# Patient Record
Sex: Male | Born: 1965 | Race: White | Hispanic: No | Marital: Married | Smoking: Never smoker
Health system: Southern US, Community
[De-identification: ages and names within clinical notes are randomized; demographics above are authoritative.]

## PROBLEM LIST (undated history)

## (undated) DIAGNOSIS — E119 Type 2 diabetes mellitus without complications: Secondary | ICD-10-CM

## (undated) DIAGNOSIS — I2510 Atherosclerotic heart disease of native coronary artery without angina pectoris: Secondary | ICD-10-CM

## (undated) DIAGNOSIS — E785 Hyperlipidemia, unspecified: Secondary | ICD-10-CM

## (undated) HISTORY — PX: ABDOMINAL SURGERY: SHX537

## (undated) HISTORY — DX: Hyperlipidemia, unspecified: E78.5

## (undated) HISTORY — DX: Atherosclerotic heart disease of native coronary artery without angina pectoris: I25.10

## (undated) HISTORY — DX: Type 2 diabetes mellitus without complications: E11.9

## (undated) HISTORY — PX: EYE SURGERY: SHX253

---

## 2008-02-25 ENCOUNTER — Encounter: Admission: RE | Admit: 2008-02-25 | Discharge: 2008-02-25 | Payer: Self-pay | Admitting: Gastroenterology

## 2009-10-31 ENCOUNTER — Inpatient Hospital Stay (HOSPITAL_COMMUNITY): Admission: EM | Admit: 2009-10-31 | Discharge: 2009-11-02 | Payer: Self-pay | Admitting: Emergency Medicine

## 2009-10-31 ENCOUNTER — Emergency Department (HOSPITAL_COMMUNITY): Admission: EM | Admit: 2009-10-31 | Discharge: 2009-10-31 | Payer: Self-pay | Admitting: Emergency Medicine

## 2010-10-06 ENCOUNTER — Encounter: Payer: Self-pay | Admitting: Internal Medicine

## 2010-12-05 LAB — GLUCOSE, CAPILLARY
Glucose-Capillary: 133 mg/dL — ABNORMAL HIGH (ref 70–99)
Glucose-Capillary: 136 mg/dL — ABNORMAL HIGH (ref 70–99)
Glucose-Capillary: 169 mg/dL — ABNORMAL HIGH (ref 70–99)
Glucose-Capillary: 175 mg/dL — ABNORMAL HIGH (ref 70–99)
Glucose-Capillary: 178 mg/dL — ABNORMAL HIGH (ref 70–99)
Glucose-Capillary: 190 mg/dL — ABNORMAL HIGH (ref 70–99)
Glucose-Capillary: 218 mg/dL — ABNORMAL HIGH (ref 70–99)

## 2010-12-05 LAB — COMPREHENSIVE METABOLIC PANEL
ALT: 46 U/L (ref 0–53)
AST: 40 U/L — ABNORMAL HIGH (ref 0–37)
BUN: 14 mg/dL (ref 6–23)
BUN: 15 mg/dL (ref 6–23)
CO2: 26 mEq/L (ref 19–32)
Calcium: 9 mg/dL (ref 8.4–10.5)
Calcium: 9.1 mg/dL (ref 8.4–10.5)
Chloride: 102 mEq/L (ref 96–112)
Chloride: 99 mEq/L (ref 96–112)
Creatinine, Ser: 0.98 mg/dL (ref 0.4–1.5)
GFR calc Af Amer: >60 mL/min (ref >60)
GFR calc Af Amer: >60 mL/min (ref >60)
GFR calc non Af Amer: >60 mL/min (ref >60)
Glucose, Bld: 231 mg/dL — ABNORMAL HIGH (ref 70–99)
Sodium: 135 mEq/L (ref 135–145)
Total Protein: 7.3 g/dL (ref 6.0–8.3)

## 2010-12-05 LAB — POCT URINALYSIS DIP (DEVICE)
Glucose, UA: 500 mg/dL — AB
Glucose, UA: 500 mg/dL — AB
Ketones, ur: 40 mg/dL — AB
Nitrite: NEGATIVE
Protein, ur: 30 mg/dL — AB
Specific Gravity, Urine: 1.015 (ref 1.005–1.030)
Urobilinogen, UA: 0.2 mg/dL (ref 0.0–1.0)
pH: 7 (ref 5.0–8.0)
pH: 7 (ref 5.0–8.0)

## 2010-12-05 LAB — URINALYSIS, ROUTINE W REFLEX MICROSCOPIC
Bilirubin Urine: NEGATIVE
Glucose, UA: >1000 mg/dL — AB
Ketones, ur: 40 mg/dL — AB
Nitrite: NEGATIVE
pH: 7 (ref 5.0–8.0)

## 2010-12-05 LAB — DIFFERENTIAL
Basophils Absolute: 0 10*3/uL (ref 0.0–0.1)
Basophils Absolute: 0 10*3/uL (ref 0.0–0.1)
Basophils Relative: 0 % (ref 0–1)
Eosinophils Absolute: 0 10*3/uL (ref 0.0–0.7)
Eosinophils Relative: 0 % (ref 0–5)
Lymphocytes Relative: 6 % — ABNORMAL LOW (ref 12–46)
Monocytes Relative: 2 % — ABNORMAL LOW (ref 3–12)
Neutro Abs: 8.2 10*3/uL — ABNORMAL HIGH (ref 1.7–7.7)
Neutrophils Relative %: 91 % — ABNORMAL HIGH (ref 43–77)

## 2010-12-05 LAB — URINE MICROSCOPIC-ADD ON

## 2010-12-05 LAB — LIPASE, BLOOD
Lipase: 14 U/L (ref 11–59)
Lipase: 17 U/L (ref 11–59)

## 2010-12-05 LAB — CBC
Hemoglobin: 14.5 g/dL (ref 13.0–17.0)
MCV: 86.4 fL (ref 78.0–100.0)
Platelets: 154 10*3/uL (ref 150–400)
Platelets: 155 10*3/uL (ref 150–400)
RBC: 4.93 MIL/uL (ref 4.22–5.81)
RDW: 12.6 % (ref 11.5–15.5)

## 2013-02-25 ENCOUNTER — Other Ambulatory Visit: Payer: Self-pay | Admitting: Gastroenterology

## 2013-02-25 DIAGNOSIS — R109 Unspecified abdominal pain: Secondary | ICD-10-CM

## 2013-03-03 ENCOUNTER — Ambulatory Visit
Admission: RE | Admit: 2013-03-03 | Discharge: 2013-03-03 | Disposition: A | Discharge status: Home / Self Care | Payer: Commercial Managed Care - PPO | Source: Ambulatory Visit | Attending: Gastroenterology | Admitting: Gastroenterology

## 2013-03-03 DIAGNOSIS — R109 Unspecified abdominal pain: Secondary | ICD-10-CM

## 2015-06-06 DIAGNOSIS — H1010 Acute atopic conjunctivitis, unspecified eye: Secondary | ICD-10-CM | POA: Insufficient documentation

## 2015-06-06 DIAGNOSIS — J309 Allergic rhinitis, unspecified: Principal | ICD-10-CM

## 2015-06-06 DIAGNOSIS — J45909 Unspecified asthma, uncomplicated: Secondary | ICD-10-CM | POA: Insufficient documentation

## 2015-06-15 ENCOUNTER — Ambulatory Visit (INDEPENDENT_AMBULATORY_CARE_PROVIDER_SITE_OTHER): Payer: Commercial Managed Care - PPO | Admitting: *Deleted

## 2015-06-15 DIAGNOSIS — H1010 Acute atopic conjunctivitis, unspecified eye: Secondary | ICD-10-CM

## 2015-06-15 DIAGNOSIS — J309 Allergic rhinitis, unspecified: Secondary | ICD-10-CM

## 2015-06-25 ENCOUNTER — Ambulatory Visit (INDEPENDENT_AMBULATORY_CARE_PROVIDER_SITE_OTHER): Payer: Commercial Managed Care - PPO | Admitting: Neurology

## 2015-06-25 DIAGNOSIS — J309 Allergic rhinitis, unspecified: Secondary | ICD-10-CM

## 2015-07-09 ENCOUNTER — Ambulatory Visit (INDEPENDENT_AMBULATORY_CARE_PROVIDER_SITE_OTHER): Payer: Commercial Managed Care - PPO | Admitting: Neurology

## 2015-07-09 DIAGNOSIS — J309 Allergic rhinitis, unspecified: Secondary | ICD-10-CM

## 2015-07-23 ENCOUNTER — Ambulatory Visit (INDEPENDENT_AMBULATORY_CARE_PROVIDER_SITE_OTHER): Payer: Commercial Managed Care - PPO

## 2015-07-23 DIAGNOSIS — J309 Allergic rhinitis, unspecified: Secondary | ICD-10-CM

## 2015-07-26 DIAGNOSIS — J3089 Other allergic rhinitis: Secondary | ICD-10-CM | POA: Diagnosis not present

## 2015-07-27 DIAGNOSIS — J301 Allergic rhinitis due to pollen: Secondary | ICD-10-CM | POA: Diagnosis not present

## 2015-08-06 ENCOUNTER — Ambulatory Visit (INDEPENDENT_AMBULATORY_CARE_PROVIDER_SITE_OTHER): Payer: Commercial Managed Care - PPO | Admitting: *Deleted

## 2015-08-06 DIAGNOSIS — J309 Allergic rhinitis, unspecified: Secondary | ICD-10-CM

## 2015-10-05 ENCOUNTER — Ambulatory Visit (INDEPENDENT_AMBULATORY_CARE_PROVIDER_SITE_OTHER): Payer: Commercial Managed Care - PPO | Admitting: Neurology

## 2015-10-05 DIAGNOSIS — J309 Allergic rhinitis, unspecified: Secondary | ICD-10-CM

## 2015-10-17 ENCOUNTER — Ambulatory Visit (INDEPENDENT_AMBULATORY_CARE_PROVIDER_SITE_OTHER): Payer: Commercial Managed Care - PPO

## 2015-10-17 DIAGNOSIS — J309 Allergic rhinitis, unspecified: Secondary | ICD-10-CM | POA: Diagnosis not present

## 2015-11-05 ENCOUNTER — Ambulatory Visit (INDEPENDENT_AMBULATORY_CARE_PROVIDER_SITE_OTHER): Payer: Commercial Managed Care - PPO

## 2015-11-05 DIAGNOSIS — J309 Allergic rhinitis, unspecified: Secondary | ICD-10-CM

## 2015-11-22 ENCOUNTER — Ambulatory Visit (INDEPENDENT_AMBULATORY_CARE_PROVIDER_SITE_OTHER): Payer: Commercial Managed Care - PPO

## 2015-11-22 DIAGNOSIS — J309 Allergic rhinitis, unspecified: Secondary | ICD-10-CM | POA: Diagnosis not present

## 2015-12-03 ENCOUNTER — Ambulatory Visit (INDEPENDENT_AMBULATORY_CARE_PROVIDER_SITE_OTHER): Payer: Commercial Managed Care - PPO

## 2015-12-03 DIAGNOSIS — J309 Allergic rhinitis, unspecified: Secondary | ICD-10-CM

## 2015-12-17 ENCOUNTER — Ambulatory Visit (INDEPENDENT_AMBULATORY_CARE_PROVIDER_SITE_OTHER): Payer: Commercial Managed Care - PPO

## 2015-12-17 DIAGNOSIS — J309 Allergic rhinitis, unspecified: Secondary | ICD-10-CM | POA: Diagnosis not present

## 2016-01-17 ENCOUNTER — Ambulatory Visit (INDEPENDENT_AMBULATORY_CARE_PROVIDER_SITE_OTHER): Payer: Commercial Managed Care - PPO

## 2016-01-17 DIAGNOSIS — J309 Allergic rhinitis, unspecified: Secondary | ICD-10-CM

## 2016-01-23 ENCOUNTER — Ambulatory Visit (INDEPENDENT_AMBULATORY_CARE_PROVIDER_SITE_OTHER): Payer: Commercial Managed Care - PPO | Admitting: *Deleted

## 2016-01-23 DIAGNOSIS — J309 Allergic rhinitis, unspecified: Secondary | ICD-10-CM

## 2016-02-05 ENCOUNTER — Ambulatory Visit (INDEPENDENT_AMBULATORY_CARE_PROVIDER_SITE_OTHER): Payer: Commercial Managed Care - PPO | Admitting: *Deleted

## 2016-02-05 DIAGNOSIS — J309 Allergic rhinitis, unspecified: Secondary | ICD-10-CM | POA: Diagnosis not present

## 2016-02-15 ENCOUNTER — Ambulatory Visit (INDEPENDENT_AMBULATORY_CARE_PROVIDER_SITE_OTHER): Payer: Commercial Managed Care - PPO | Admitting: *Deleted

## 2016-02-15 DIAGNOSIS — J309 Allergic rhinitis, unspecified: Secondary | ICD-10-CM

## 2016-02-20 ENCOUNTER — Ambulatory Visit (INDEPENDENT_AMBULATORY_CARE_PROVIDER_SITE_OTHER): Payer: Commercial Managed Care - PPO | Admitting: *Deleted

## 2016-02-20 DIAGNOSIS — J309 Allergic rhinitis, unspecified: Secondary | ICD-10-CM | POA: Diagnosis not present

## 2016-03-25 DIAGNOSIS — J3089 Other allergic rhinitis: Secondary | ICD-10-CM

## 2016-03-26 DIAGNOSIS — J301 Allergic rhinitis due to pollen: Secondary | ICD-10-CM | POA: Diagnosis not present

## 2016-03-27 ENCOUNTER — Ambulatory Visit (INDEPENDENT_AMBULATORY_CARE_PROVIDER_SITE_OTHER): Payer: Commercial Managed Care - PPO

## 2016-03-27 DIAGNOSIS — J309 Allergic rhinitis, unspecified: Secondary | ICD-10-CM | POA: Diagnosis not present

## 2016-04-10 ENCOUNTER — Ambulatory Visit (INDEPENDENT_AMBULATORY_CARE_PROVIDER_SITE_OTHER): Payer: Commercial Managed Care - PPO

## 2016-04-10 DIAGNOSIS — J309 Allergic rhinitis, unspecified: Secondary | ICD-10-CM | POA: Diagnosis not present

## 2016-04-25 ENCOUNTER — Ambulatory Visit (INDEPENDENT_AMBULATORY_CARE_PROVIDER_SITE_OTHER): Payer: Commercial Managed Care - PPO | Admitting: *Deleted

## 2016-04-25 DIAGNOSIS — J309 Allergic rhinitis, unspecified: Secondary | ICD-10-CM | POA: Diagnosis not present

## 2016-05-01 ENCOUNTER — Ambulatory Visit (INDEPENDENT_AMBULATORY_CARE_PROVIDER_SITE_OTHER): Payer: Commercial Managed Care - PPO

## 2016-05-01 DIAGNOSIS — J309 Allergic rhinitis, unspecified: Secondary | ICD-10-CM | POA: Diagnosis not present

## 2016-05-15 ENCOUNTER — Ambulatory Visit (INDEPENDENT_AMBULATORY_CARE_PROVIDER_SITE_OTHER): Payer: Commercial Managed Care - PPO | Admitting: *Deleted

## 2016-05-15 DIAGNOSIS — J309 Allergic rhinitis, unspecified: Secondary | ICD-10-CM | POA: Diagnosis not present

## 2016-05-29 ENCOUNTER — Ambulatory Visit (INDEPENDENT_AMBULATORY_CARE_PROVIDER_SITE_OTHER): Payer: Commercial Managed Care - PPO | Admitting: *Deleted

## 2016-05-29 DIAGNOSIS — J309 Allergic rhinitis, unspecified: Secondary | ICD-10-CM

## 2016-06-10 ENCOUNTER — Ambulatory Visit (INDEPENDENT_AMBULATORY_CARE_PROVIDER_SITE_OTHER): Payer: Commercial Managed Care - PPO

## 2016-06-10 DIAGNOSIS — J309 Allergic rhinitis, unspecified: Secondary | ICD-10-CM | POA: Diagnosis not present

## 2016-06-27 ENCOUNTER — Ambulatory Visit (INDEPENDENT_AMBULATORY_CARE_PROVIDER_SITE_OTHER): Payer: Commercial Managed Care - PPO

## 2016-06-27 DIAGNOSIS — J309 Allergic rhinitis, unspecified: Secondary | ICD-10-CM

## 2016-07-10 ENCOUNTER — Ambulatory Visit (INDEPENDENT_AMBULATORY_CARE_PROVIDER_SITE_OTHER): Payer: Commercial Managed Care - PPO | Admitting: *Deleted

## 2016-07-10 DIAGNOSIS — J309 Allergic rhinitis, unspecified: Secondary | ICD-10-CM

## 2016-07-25 ENCOUNTER — Ambulatory Visit (INDEPENDENT_AMBULATORY_CARE_PROVIDER_SITE_OTHER): Payer: Commercial Managed Care - PPO

## 2016-07-25 DIAGNOSIS — J309 Allergic rhinitis, unspecified: Secondary | ICD-10-CM | POA: Diagnosis not present

## 2016-09-02 ENCOUNTER — Ambulatory Visit (INDEPENDENT_AMBULATORY_CARE_PROVIDER_SITE_OTHER): Payer: Commercial Managed Care - PPO | Admitting: *Deleted

## 2016-09-02 DIAGNOSIS — J309 Allergic rhinitis, unspecified: Secondary | ICD-10-CM | POA: Diagnosis not present

## 2016-09-17 DIAGNOSIS — H1033 Unspecified acute conjunctivitis, bilateral: Secondary | ICD-10-CM | POA: Diagnosis not present

## 2016-09-25 ENCOUNTER — Ambulatory Visit (INDEPENDENT_AMBULATORY_CARE_PROVIDER_SITE_OTHER): Payer: Commercial Managed Care - PPO

## 2016-09-25 DIAGNOSIS — J309 Allergic rhinitis, unspecified: Secondary | ICD-10-CM | POA: Diagnosis not present

## 2016-09-26 DIAGNOSIS — J3089 Other allergic rhinitis: Secondary | ICD-10-CM | POA: Diagnosis not present

## 2016-09-29 DIAGNOSIS — J301 Allergic rhinitis due to pollen: Secondary | ICD-10-CM | POA: Diagnosis not present

## 2016-09-30 DIAGNOSIS — E109 Type 1 diabetes mellitus without complications: Secondary | ICD-10-CM | POA: Diagnosis not present

## 2016-10-03 NOTE — Addendum Note (Signed)
Addended by: Katherina Right D on: 10/03/2016 01:26 PM   Modules accepted: Orders

## 2016-10-04 ENCOUNTER — Ambulatory Visit (INDEPENDENT_AMBULATORY_CARE_PROVIDER_SITE_OTHER): Payer: Self-pay | Admitting: Nurse Practitioner

## 2016-10-04 VITALS — BP 122/64 | Temp 99.4°F | Wt 175.0 lb

## 2016-10-04 DIAGNOSIS — J069 Acute upper respiratory infection, unspecified: Secondary | ICD-10-CM

## 2016-10-04 MED ORDER — ALBUTEROL SULFATE HFA 108 (90 BASE) MCG/ACT IN AERS: 2.0000 | INHALATION_SPRAY | Freq: Four times a day (QID) | RESPIRATORY_TRACT | 0 refills | Status: DC | PRN

## 2016-10-04 MED ORDER — FLUTICASONE PROPIONATE 50 MCG/ACT NA SUSP: 2.0000 | Freq: Every day | NASAL | 0 refills | Status: DC

## 2016-10-04 MED ORDER — BENZONATATE 100 MG PO CAPS: 100.0000 mg | ORAL_CAPSULE | Freq: Two times a day (BID) | ORAL | 0 refills | Status: AC | PRN

## 2016-10-04 NOTE — Patient Instructions (Addendum)
Upper Respiratory Infection, Adult Most upper respiratory infections (URIs) are a viral infection of the air passages leading to the lungs. A URI affects the nose, throat, and upper air passages. The most common type of URI is nasopharyngitis and is typically referred to as "the common cold." URIs run their course and usually go away on their own. Most of the time, a URI does not require medical attention, but sometimes a bacterial infection in the upper airways can follow a viral infection. This is called a secondary infection. Sinus and middle ear infections are common types of secondary upper respiratory infections. Bacterial pneumonia can also complicate a URI. A URI can worsen asthma and chronic obstructive pulmonary disease (COPD). Sometimes, these complications can require emergency medical care and may be life threatening. What are the causes? Almost all URIs are caused by viruses. A virus is a type of germ and can spread from one person to another. What increases the risk? You may be at risk for a URI if:  You smoke.  You have chronic heart or lung disease.  You have a weakened defense (immune) system.  You are very young or very old.  You have nasal allergies or asthma.  You work in crowded or poorly ventilated areas.  You work in health care facilities or schools.  What are the signs or symptoms? Symptoms typically develop 2-3 days after you come in contact with a cold virus. Most viral URIs last 7-10 days. However, viral URIs from the influenza virus (flu virus) can last 14-18 days and are typically more severe. Symptoms may include:  Runny or stuffy (congested) nose.  Sneezing.  Cough.  Sore throat.  Headache.  Fatigue.  Fever.  Loss of appetite.  Pain in your forehead, behind your eyes, and over your cheekbones (sinus pain).  Muscle aches.  How is this diagnosed? Your health care provider may diagnose a URI by:  Physical exam.  Tests to check that your  symptoms are not due to another condition such as: ? Strep throat. ? Sinusitis. ? Pneumonia. ? Asthma.  How is this treated? A URI goes away on its own with time. It cannot be cured with medicines, but medicines may be prescribed or recommended to relieve symptoms. Medicines may help:  Reduce your fever.  Reduce your cough.  Relieve nasal congestion.  Follow these instructions at home:  Take medicines only as directed by your health care provider.  Gargle warm saltwater or take cough drops to comfort your throat as directed by your health care provider.  Use a warm mist humidifier or inhale steam from a shower to increase air moisture. This may make it easier to breathe.  Drink enough fluid to keep your urine clear or pale yellow.  Eat soups and other clear broths and maintain good nutrition.  Rest as needed.  Return to work when your temperature has returned to normal or as your health care provider advises. You may need to stay home longer to avoid infecting others. You can also use a face mask and careful hand washing to prevent spread of the virus.  Increase the usage of your inhaler if you have asthma.  Do not use any tobacco products, including cigarettes, chewing tobacco, or electronic cigarettes. If you need help quitting, ask your health care provider. How is this prevented? The best way to protect yourself from getting a cold is to practice good hygiene.  Avoid oral or hand contact with people with cold symptoms.  Wash your   occurs. There is no clear evidence that vitamin C, vitamin E, echinacea, or exercise reduces the chance of developing a cold. However, it is always recommended to get plenty of rest, exercise, and practice good nutrition. Contact a health care provider if:  You are getting worse rather than better.  Your symptoms are not controlled by medicine.  You have chills.  You have worsening shortness of breath.  You have brown  or red mucus.  You have yellow or brown nasal discharge.  You have pain in your face, especially when you bend forward.  You have a fever.  You have swollen neck glands.  You have pain while swallowing.  You have white areas in the back of your throat. Get help right away if:  You have severe or persistent:  Headache.  Ear pain.  Sinus pain.  Chest pain.  You have chronic lung disease and any of the following:  Wheezing.  Prolonged cough.  Coughing up blood.  A change in your usual mucus.  You have a stiff neck.  You have changes in your:  Vision.  Hearing.  Thinking.  Mood. This information is not intended to replace advice given to you by your health care provider. Make sure you discuss any questions you have with your health care provider. Document Released: 02/25/2001 Document Revised: 05/04/2016 Document Reviewed: 12/07/2013 Elsevier Interactive Patient Education  2017 [employer]  Bronchospasm, Adult A bronchospasm is a spasm or tightening of the airways going into the lungs. During a bronchospasm breathing becomes more difficult because the airways get smaller. When this happens there can be coughing, a whistling sound when breathing (wheezing), and difficulty breathing. Bronchospasm is often associated with asthma, but not all patients who experience a bronchospasm have asthma. What are the causes? A bronchospasm is caused by inflammation or irritation of the airways. The inflammation or irritation may be triggered by:  Allergies (such as to animals, pollen, food, or mold). Allergens that cause bronchospasm may cause wheezing immediately after exposure or many hours later.  Infection. Viral infections are believed to be the most common cause of bronchospasm.  Exercise.  Irritants (such as pollution, cigarette smoke, strong odors, aerosol sprays, and paint fumes).  Weather changes. Winds increase molds and pollens in the air. Rain refreshes the  air by washing irritants out. Cold air may cause inflammation.  Stress and emotional upset. What are the signs or symptoms?  Wheezing.  Excessive nighttime coughing.  Frequent or severe coughing with a simple cold.  Chest tightness.  Shortness of breath. How is this diagnosed? Bronchospasm is usually diagnosed through a history and physical exam. Tests, such as chest X-rays, are sometimes done to look for other conditions. How is this treated?  Inhaled medicines can be given to open up your airways and help you breathe. The medicines can be given using either an inhaler or a nebulizer machine.  Corticosteroid medicines may be given for severe bronchospasm, usually when it is associated with asthma. Follow these instructions at home:  Always have a plan prepared for seeking medical care. Know when to call your health care provider and local emergency services (911 in the [place] Know where you can access local emergency care.  Only take medicines as directed by your health care provider.  If you were prescribed an inhaler or nebulizer machine, ask your health care provider to explain how to use it correctly. Always use a spacer with your inhaler if you were given one.  It is necessary to  remain calm during an attack. Try to relax and breathe more slowly.  Control your home environment in the following ways:  Change your heating and air conditioning filter at least once a month.  Limit your use of fireplaces and wood stoves.  Do not smoke and do not allow smoking in your home.  Avoid exposure to perfumes and fragrances.  Get rid of pests (such as roaches and mice) and their droppings.  Throw away plants if you see mold on them.  Keep your house clean and dust free.  Replace carpet with wood, tile, or vinyl flooring. Carpet can trap dander and dust.  Use allergy-proof pillows, mattress covers, and box spring covers.  Wash bed sheets and blankets every week in hot water  and dry them in a dryer.  Use blankets that are made of polyester or cotton.  Wash hands frequently. Contact a health care provider if:  You have muscle aches.  You have chest pain.  The sputum changes from clear or white to yellow, green, gray, or bloody.  The sputum you cough up gets thicker.  There are problems that may be related to the medicine you are given, such as a rash, itching, swelling, or trouble breathing. Get help right away if:  You have worsening wheezing and coughing even after taking your prescribed medicines.  You have increased difficulty breathing.  You develop severe chest pain. This information is not intended to replace advice given to you by your health care provider. Make sure you discuss any questions you have with your health care provider. Document Released: 09/04/2003 Document Revised: 02/07/2016 Document Reviewed: 02/21/2013 Elsevier Interactive Patient Education  2017 [employer].

## 2016-10-04 NOTE — Progress Notes (Signed)
Subjective:     Stephen Marks is a 51 y.o. male who presents for evaluation of symptoms of a URI. Symptoms include achiness, congestion, headache described as dull, frontal, low grade fever, post nasal drip and sinus pressure. Onset of symptoms was 1 day ago, and has been gradually worsening since that time. Treatment to date: Tylenol, Delsym and Nasacort.  Patient has a history of seasonal allergies and is taking Zyrtec and Singulaiir..  The following portions of the patient's history were reviewed and updated as appropriate: allergies, current medications and past medical history.  Review of Systems Constitutional: positive for chills and fevers, negative for chills, fatigue and fevers Eyes: positive for clear drainage Ears, nose, mouth, throat, and face: negative Respiratory: positive for cough Cardiovascular: negative Gastrointestinal: positive for nausea Neurological: positive for headaches Allergic/Immunologic: positive for hay fever   Objective:    BP 122/64   Temp 99.4 F (37.4 C)   Wt 175 lb (79.4 kg)  General appearance: alert, cooperative and no distress Head: Normocephalic, without obvious abnormality, atraumatic Eyes: conjunctivae/corneas clear. PERRL, EOM's intact. Fundi benign. Ears: normal TM's and external ear canals both ears Nose: clear discharge, mild congestion, turbinates red, sinus tenderness bilateral Throat: lips, mucosa, and tongue normal; teeth and gums normal Lungs: wheezes posterior - left Heart: regular rate and rhythm, S1, S2 normal, no murmur, click, rub or gallop Skin: Skin color, texture, turgor normal. No rashes or lesions Neurologic: Alert and oriented X 3, normal strength and tone. Normal symmetric reflexes. Normal coordination and gait   Assessment:    bronchospasm and viral upper respiratory illness   Plan:    Discussed diagnosis and treatment of URI. Suggested symptomatic OTC remedies. Nasal steroids per orders. Follow up as needed.

## 2016-10-06 ENCOUNTER — Telehealth: Payer: Self-pay | Admitting: Nurse Practitioner

## 2016-10-06 NOTE — Telephone Encounter (Signed)
Called patient to f/u.  Reached vm, left message.

## 2016-10-06 NOTE — Telephone Encounter (Signed)
Patient returned phone call.  Patient states he is feeling better today, although he thinks he may have had a fever on 1/21.  Patient states he is able to cough up mucous and this chest feels like its clearing.  Patient also continues to use Flonase which has helped.  Patient will back if symptoms worsen.

## 2016-10-18 ENCOUNTER — Ambulatory Visit (INDEPENDENT_AMBULATORY_CARE_PROVIDER_SITE_OTHER): Payer: Self-pay | Admitting: Family

## 2016-10-18 VITALS — BP 120/80 | HR 79 | Temp 98.9°F | Wt 170.8 lb

## 2016-10-18 DIAGNOSIS — J309 Allergic rhinitis, unspecified: Secondary | ICD-10-CM

## 2016-10-18 DIAGNOSIS — J019 Acute sinusitis, unspecified: Secondary | ICD-10-CM

## 2016-10-18 DIAGNOSIS — B9689 Other specified bacterial agents as the cause of diseases classified elsewhere: Secondary | ICD-10-CM

## 2016-10-18 MED ORDER — DOXYCYCLINE HYCLATE 100 MG PO TABS: 100.0000 mg | ORAL_TABLET | Freq: Two times a day (BID) | ORAL | 0 refills | Status: DC

## 2016-10-18 NOTE — Patient Instructions (Signed)

## 2016-10-18 NOTE — Progress Notes (Signed)
Subjective:     Patient ID: Stephen Marks, male   DOB: 12-21-1965, 51 y.o.   MRN: [id number]  HPI 51 year old white male, nonsmoker, with a history of HTN, and Allergic Rhinitis is in today after being seen 2-3 weeks ago for a URI. symptoms have somewhat improved but continues to have nasal congestion with thick yellow discharge from the left nare in particular. Pain over the left frontal sinus. No fever. Endorses sore throat. Gets weekly allergy injections  Review of Systems  Constitutional: Negative.   HENT: Positive for congestion, postnasal drip, sinus pressure, sneezing and sore throat.   Respiratory: Negative.   Cardiovascular: Negative.   Endocrine: Negative.   Genitourinary: Negative.   Skin: Negative.   Allergic/Immunologic: Positive for environmental allergies.  Hematological: Negative.   Psychiatric/Behavioral: Negative.    No past medical history on file.  Social History   Social History  . Marital status: Married    Spouse name: N/A  . Number of children: N/A  . Years of education: N/A   Occupational History  . Not on file.   Social History Main Topics  . Smoking status: Never Smoker  . Smokeless tobacco: Never Used  . Alcohol use Not on file  . Drug use: Unknown  . Sexual activity: Not on file   Other Topics Concern  . Not on file   Social History Narrative  . No narrative on file    No past surgical history on file.  No family history on file.  Allergies  Allergen Reactions  . Penicillins Hives  . Sulfa Antibiotics     Current Outpatient Prescriptions on File Prior to Visit  Medication Sig Dispense Refill  . albuterol (PROVENTIL HFA) 108 (90 BASE) MCG/ACT inhaler Inhale 2 puffs into the lungs every 4 (four) hours as needed for wheezing or shortness of breath.    . cetirizine (ZYRTEC) 10 MG tablet Take 10 mg by mouth daily.    . montelukast (SINGULAIR) 10 MG tablet Take 10 mg by mouth as needed.    . ramipril (ALTACE) 10 MG capsule Take 10 mg  by mouth daily.    Marland Kitchen albuterol (PROVENTIL HFA;VENTOLIN HFA) 108 (90 Base) MCG/ACT inhaler Inhale 2 puffs into the lungs every 6 (six) hours as needed for wheezing or shortness of breath (Wheezing). 1 Inhaler 0  . fluticasone (FLONASE) 50 MCG/ACT nasal spray Place 2 sprays into both nostrils daily. 16 g 0   No current facility-administered medications on file prior to visit.     BP 120/80   Pulse 79   Temp 98.9 F (37.2 C) (Oral)   Wt 170 lb 12.8 oz (77.5 kg)   SpO2 98% chart    Objective:   Physical Exam  Constitutional: He is oriented to person, place, and time. He appears well-developed and well-nourished.  HENT:  Right Ear: External ear normal.  Left Ear: External ear normal.  Left frontal sinus tenderness  Neck: Normal range of motion. Neck supple.  Cardiovascular: Normal rate, regular rhythm and normal heart sounds.   Pulmonary/Chest: Effort normal and breath sounds normal.  Musculoskeletal: Normal range of motion.  Neurological: He is alert and oriented to person, place, and time.  Skin: Skin is warm and dry.  Psychiatric: He has a normal mood and affect.       Assessment:     [name] was seen today for sinusitis.  Diagnoses and all orders for this visit:  Acute bacterial sinusitis  Acute allergic rhinitis, unspecified seasonality, unspecified  trigger  Other orders -     doxycycline (VIBRA-TABS) 100 MG tablet; Take 1 tablet (100 mg total) by mouth 2 (two) times daily.      Plan:     Continue Flonase and Zyrtec as daily. Call the office with any questions or concerns. Recheck as needed.

## 2016-10-20 DIAGNOSIS — D225 Melanocytic nevi of trunk: Secondary | ICD-10-CM | POA: Diagnosis not present

## 2016-10-20 DIAGNOSIS — L814 Other melanin hyperpigmentation: Secondary | ICD-10-CM | POA: Diagnosis not present

## 2016-10-20 DIAGNOSIS — D1801 Hemangioma of skin and subcutaneous tissue: Secondary | ICD-10-CM | POA: Diagnosis not present

## 2016-10-28 DIAGNOSIS — J029 Acute pharyngitis, unspecified: Secondary | ICD-10-CM | POA: Diagnosis not present

## 2016-10-28 DIAGNOSIS — Z6824 Body mass index (BMI) 24.0-24.9, adult: Secondary | ICD-10-CM | POA: Diagnosis not present

## 2016-11-19 DIAGNOSIS — E1069 Type 1 diabetes mellitus with other specified complication: Secondary | ICD-10-CM | POA: Diagnosis not present

## 2016-11-19 DIAGNOSIS — E1065 Type 1 diabetes mellitus with hyperglycemia: Secondary | ICD-10-CM | POA: Diagnosis not present

## 2016-11-19 DIAGNOSIS — E109 Type 1 diabetes mellitus without complications: Secondary | ICD-10-CM | POA: Diagnosis not present

## 2016-11-25 ENCOUNTER — Ambulatory Visit (INDEPENDENT_AMBULATORY_CARE_PROVIDER_SITE_OTHER): Payer: Commercial Managed Care - PPO | Admitting: *Deleted

## 2016-11-25 DIAGNOSIS — J309 Allergic rhinitis, unspecified: Secondary | ICD-10-CM | POA: Diagnosis not present

## 2016-12-01 ENCOUNTER — Ambulatory Visit (INDEPENDENT_AMBULATORY_CARE_PROVIDER_SITE_OTHER): Payer: Commercial Managed Care - PPO

## 2016-12-01 DIAGNOSIS — J309 Allergic rhinitis, unspecified: Secondary | ICD-10-CM | POA: Diagnosis not present

## 2016-12-11 DIAGNOSIS — Z125 Encounter for screening for malignant neoplasm of prostate: Secondary | ICD-10-CM | POA: Diagnosis not present

## 2016-12-15 ENCOUNTER — Ambulatory Visit (INDEPENDENT_AMBULATORY_CARE_PROVIDER_SITE_OTHER): Payer: Commercial Managed Care - PPO | Admitting: *Deleted

## 2016-12-15 ENCOUNTER — Telehealth: Payer: Self-pay | Admitting: Allergy and Immunology

## 2016-12-15 DIAGNOSIS — J309 Allergic rhinitis, unspecified: Secondary | ICD-10-CM

## 2016-12-15 NOTE — Telephone Encounter (Signed)
Left message that I will call him back tomorrow

## 2016-12-15 NOTE — Telephone Encounter (Signed)
patient receives allergy injections - and [facility] is in network and it was covered, some injections were billed under [place] and they were not covered, stating she was out of network. It would have been last Oct or Nov after she had left the practice Patient wants to know why they were billed under another name and can they be resubmitted to insurance under [place] so they can potentially be paid.

## 2016-12-16 DIAGNOSIS — E119 Type 2 diabetes mellitus without complications: Secondary | ICD-10-CM | POA: Diagnosis not present

## 2016-12-16 NOTE — Telephone Encounter (Signed)
Left message again - explained that I either refiled claims with Dr. Ishmael Holter' name or adjusted

## 2016-12-23 ENCOUNTER — Ambulatory Visit (INDEPENDENT_AMBULATORY_CARE_PROVIDER_SITE_OTHER): Payer: Commercial Managed Care - PPO | Admitting: *Deleted

## 2016-12-23 DIAGNOSIS — J309 Allergic rhinitis, unspecified: Secondary | ICD-10-CM | POA: Diagnosis not present

## 2016-12-29 DIAGNOSIS — R972 Elevated prostate specific antigen [PSA]: Secondary | ICD-10-CM | POA: Diagnosis not present

## 2017-01-02 ENCOUNTER — Ambulatory Visit (INDEPENDENT_AMBULATORY_CARE_PROVIDER_SITE_OTHER): Payer: Commercial Managed Care - PPO

## 2017-01-02 DIAGNOSIS — J309 Allergic rhinitis, unspecified: Secondary | ICD-10-CM | POA: Diagnosis not present

## 2017-01-08 DIAGNOSIS — Z0000 Encounter for general adult medical examination without abnormal findings: Secondary | ICD-10-CM | POA: Diagnosis not present

## 2017-01-08 DIAGNOSIS — E109 Type 1 diabetes mellitus without complications: Secondary | ICD-10-CM | POA: Diagnosis not present

## 2017-01-08 DIAGNOSIS — Z125 Encounter for screening for malignant neoplasm of prostate: Secondary | ICD-10-CM | POA: Diagnosis not present

## 2017-01-12 ENCOUNTER — Ambulatory Visit (INDEPENDENT_AMBULATORY_CARE_PROVIDER_SITE_OTHER): Payer: Commercial Managed Care - PPO | Admitting: *Deleted

## 2017-01-12 DIAGNOSIS — J309 Allergic rhinitis, unspecified: Secondary | ICD-10-CM

## 2017-01-15 DIAGNOSIS — E109 Type 1 diabetes mellitus without complications: Secondary | ICD-10-CM | POA: Diagnosis not present

## 2017-01-15 DIAGNOSIS — Z1389 Encounter for screening for other disorder: Secondary | ICD-10-CM | POA: Diagnosis not present

## 2017-01-15 DIAGNOSIS — N400 Enlarged prostate without lower urinary tract symptoms: Secondary | ICD-10-CM | POA: Diagnosis not present

## 2017-01-15 DIAGNOSIS — Z0000 Encounter for general adult medical examination without abnormal findings: Secondary | ICD-10-CM | POA: Diagnosis not present

## 2017-01-20 ENCOUNTER — Other Ambulatory Visit: Payer: Self-pay | Admitting: Internal Medicine

## 2017-01-20 DIAGNOSIS — Z6824 Body mass index (BMI) 24.0-24.9, adult: Secondary | ICD-10-CM | POA: Diagnosis not present

## 2017-01-20 DIAGNOSIS — M542 Cervicalgia: Secondary | ICD-10-CM | POA: Diagnosis not present

## 2017-01-20 DIAGNOSIS — R221 Localized swelling, mass and lump, neck: Secondary | ICD-10-CM

## 2017-01-23 ENCOUNTER — Ambulatory Visit (INDEPENDENT_AMBULATORY_CARE_PROVIDER_SITE_OTHER): Payer: Commercial Managed Care - PPO | Admitting: *Deleted

## 2017-01-23 DIAGNOSIS — J309 Allergic rhinitis, unspecified: Secondary | ICD-10-CM | POA: Diagnosis not present

## 2017-01-26 ENCOUNTER — Ambulatory Visit
Admission: RE | Admit: 2017-01-26 | Discharge: 2017-01-26 | Disposition: A | Discharge status: Home / Self Care | Payer: Commercial Managed Care - PPO | Source: Ambulatory Visit | Attending: Internal Medicine | Admitting: Internal Medicine

## 2017-01-26 DIAGNOSIS — M542 Cervicalgia: Secondary | ICD-10-CM | POA: Diagnosis not present

## 2017-01-26 DIAGNOSIS — R221 Localized swelling, mass and lump, neck: Secondary | ICD-10-CM

## 2017-01-29 DIAGNOSIS — E1065 Type 1 diabetes mellitus with hyperglycemia: Secondary | ICD-10-CM | POA: Diagnosis not present

## 2017-01-29 DIAGNOSIS — E1069 Type 1 diabetes mellitus with other specified complication: Secondary | ICD-10-CM | POA: Diagnosis not present

## 2017-01-29 DIAGNOSIS — E109 Type 1 diabetes mellitus without complications: Secondary | ICD-10-CM | POA: Diagnosis not present

## 2017-01-30 ENCOUNTER — Ambulatory Visit (INDEPENDENT_AMBULATORY_CARE_PROVIDER_SITE_OTHER): Payer: Commercial Managed Care - PPO

## 2017-01-30 DIAGNOSIS — J309 Allergic rhinitis, unspecified: Secondary | ICD-10-CM

## 2017-02-12 ENCOUNTER — Ambulatory Visit (INDEPENDENT_AMBULATORY_CARE_PROVIDER_SITE_OTHER): Payer: Commercial Managed Care - PPO | Admitting: *Deleted

## 2017-02-12 DIAGNOSIS — J309 Allergic rhinitis, unspecified: Secondary | ICD-10-CM

## 2017-02-12 DIAGNOSIS — R0789 Other chest pain: Secondary | ICD-10-CM | POA: Diagnosis not present

## 2017-02-12 DIAGNOSIS — Z0189 Encounter for other specified special examinations: Secondary | ICD-10-CM | POA: Diagnosis not present

## 2017-02-12 DIAGNOSIS — R0602 Shortness of breath: Secondary | ICD-10-CM | POA: Diagnosis not present

## 2017-02-27 DIAGNOSIS — E1069 Type 1 diabetes mellitus with other specified complication: Secondary | ICD-10-CM | POA: Diagnosis not present

## 2017-02-27 DIAGNOSIS — E1065 Type 1 diabetes mellitus with hyperglycemia: Secondary | ICD-10-CM | POA: Diagnosis not present

## 2017-02-27 DIAGNOSIS — E109 Type 1 diabetes mellitus without complications: Secondary | ICD-10-CM | POA: Diagnosis not present

## 2017-03-04 ENCOUNTER — Ambulatory Visit (INDEPENDENT_AMBULATORY_CARE_PROVIDER_SITE_OTHER): Payer: Commercial Managed Care - PPO

## 2017-03-04 DIAGNOSIS — J309 Allergic rhinitis, unspecified: Secondary | ICD-10-CM | POA: Diagnosis not present

## 2017-03-19 ENCOUNTER — Ambulatory Visit (INDEPENDENT_AMBULATORY_CARE_PROVIDER_SITE_OTHER): Payer: Commercial Managed Care - PPO | Admitting: *Deleted

## 2017-03-19 DIAGNOSIS — J309 Allergic rhinitis, unspecified: Secondary | ICD-10-CM

## 2017-04-07 DIAGNOSIS — R0602 Shortness of breath: Secondary | ICD-10-CM | POA: Diagnosis not present

## 2017-04-07 DIAGNOSIS — R0789 Other chest pain: Secondary | ICD-10-CM | POA: Diagnosis not present

## 2017-04-16 DIAGNOSIS — J3089 Other allergic rhinitis: Secondary | ICD-10-CM | POA: Diagnosis not present

## 2017-04-22 DIAGNOSIS — E109 Type 1 diabetes mellitus without complications: Secondary | ICD-10-CM | POA: Diagnosis not present

## 2017-04-22 DIAGNOSIS — E1065 Type 1 diabetes mellitus with hyperglycemia: Secondary | ICD-10-CM | POA: Diagnosis not present

## 2017-04-22 DIAGNOSIS — E1069 Type 1 diabetes mellitus with other specified complication: Secondary | ICD-10-CM | POA: Diagnosis not present

## 2017-04-24 ENCOUNTER — Ambulatory Visit (INDEPENDENT_AMBULATORY_CARE_PROVIDER_SITE_OTHER): Payer: Commercial Managed Care - PPO

## 2017-04-24 DIAGNOSIS — J309 Allergic rhinitis, unspecified: Secondary | ICD-10-CM

## 2017-05-01 DIAGNOSIS — R0789 Other chest pain: Secondary | ICD-10-CM | POA: Diagnosis not present

## 2017-05-01 DIAGNOSIS — Z0189 Encounter for other specified special examinations: Secondary | ICD-10-CM | POA: Diagnosis not present

## 2017-05-01 DIAGNOSIS — R0602 Shortness of breath: Secondary | ICD-10-CM | POA: Diagnosis not present

## 2017-05-05 ENCOUNTER — Ambulatory Visit (INDEPENDENT_AMBULATORY_CARE_PROVIDER_SITE_OTHER): Payer: Commercial Managed Care - PPO | Admitting: *Deleted

## 2017-05-05 DIAGNOSIS — J309 Allergic rhinitis, unspecified: Secondary | ICD-10-CM

## 2017-05-26 ENCOUNTER — Ambulatory Visit (INDEPENDENT_AMBULATORY_CARE_PROVIDER_SITE_OTHER): Payer: Commercial Managed Care - PPO | Admitting: *Deleted

## 2017-05-26 DIAGNOSIS — J309 Allergic rhinitis, unspecified: Secondary | ICD-10-CM | POA: Diagnosis not present

## 2017-06-08 DIAGNOSIS — E109 Type 1 diabetes mellitus without complications: Secondary | ICD-10-CM | POA: Diagnosis not present

## 2017-06-08 DIAGNOSIS — E1065 Type 1 diabetes mellitus with hyperglycemia: Secondary | ICD-10-CM | POA: Diagnosis not present

## 2017-06-08 DIAGNOSIS — E1069 Type 1 diabetes mellitus with other specified complication: Secondary | ICD-10-CM | POA: Diagnosis not present

## 2017-06-10 DIAGNOSIS — K219 Gastro-esophageal reflux disease without esophagitis: Secondary | ICD-10-CM | POA: Diagnosis not present

## 2017-06-10 DIAGNOSIS — Z23 Encounter for immunization: Secondary | ICD-10-CM | POA: Diagnosis not present

## 2017-06-10 DIAGNOSIS — J302 Other seasonal allergic rhinitis: Secondary | ICD-10-CM | POA: Diagnosis not present

## 2017-06-10 DIAGNOSIS — E109 Type 1 diabetes mellitus without complications: Secondary | ICD-10-CM | POA: Diagnosis not present

## 2017-06-18 ENCOUNTER — Ambulatory Visit (INDEPENDENT_AMBULATORY_CARE_PROVIDER_SITE_OTHER): Payer: Commercial Managed Care - PPO | Admitting: *Deleted

## 2017-06-18 DIAGNOSIS — J309 Allergic rhinitis, unspecified: Secondary | ICD-10-CM | POA: Diagnosis not present

## 2017-07-09 ENCOUNTER — Ambulatory Visit (INDEPENDENT_AMBULATORY_CARE_PROVIDER_SITE_OTHER): Payer: Commercial Managed Care - PPO | Admitting: *Deleted

## 2017-07-09 DIAGNOSIS — J309 Allergic rhinitis, unspecified: Secondary | ICD-10-CM | POA: Diagnosis not present

## 2017-07-17 ENCOUNTER — Ambulatory Visit (INDEPENDENT_AMBULATORY_CARE_PROVIDER_SITE_OTHER): Payer: Commercial Managed Care - PPO

## 2017-07-17 DIAGNOSIS — J309 Allergic rhinitis, unspecified: Secondary | ICD-10-CM | POA: Diagnosis not present

## 2017-07-22 DIAGNOSIS — L308 Other specified dermatitis: Secondary | ICD-10-CM | POA: Diagnosis not present

## 2017-07-22 DIAGNOSIS — L0291 Cutaneous abscess, unspecified: Secondary | ICD-10-CM | POA: Diagnosis not present

## 2017-07-28 ENCOUNTER — Ambulatory Visit (INDEPENDENT_AMBULATORY_CARE_PROVIDER_SITE_OTHER): Payer: Commercial Managed Care - PPO | Admitting: *Deleted

## 2017-07-28 DIAGNOSIS — J309 Allergic rhinitis, unspecified: Secondary | ICD-10-CM

## 2017-08-05 ENCOUNTER — Ambulatory Visit (INDEPENDENT_AMBULATORY_CARE_PROVIDER_SITE_OTHER): Payer: Commercial Managed Care - PPO

## 2017-08-05 DIAGNOSIS — J309 Allergic rhinitis, unspecified: Secondary | ICD-10-CM | POA: Diagnosis not present

## 2017-08-10 DIAGNOSIS — L0291 Cutaneous abscess, unspecified: Secondary | ICD-10-CM | POA: Diagnosis not present

## 2017-08-21 ENCOUNTER — Ambulatory Visit (INDEPENDENT_AMBULATORY_CARE_PROVIDER_SITE_OTHER): Payer: Commercial Managed Care - PPO

## 2017-08-21 DIAGNOSIS — J309 Allergic rhinitis, unspecified: Secondary | ICD-10-CM

## 2017-08-24 DIAGNOSIS — E1065 Type 1 diabetes mellitus with hyperglycemia: Secondary | ICD-10-CM | POA: Diagnosis not present

## 2017-08-24 DIAGNOSIS — E109 Type 1 diabetes mellitus without complications: Secondary | ICD-10-CM | POA: Diagnosis not present

## 2017-08-24 DIAGNOSIS — E1069 Type 1 diabetes mellitus with other specified complication: Secondary | ICD-10-CM | POA: Diagnosis not present

## 2017-09-14 ENCOUNTER — Ambulatory Visit (INDEPENDENT_AMBULATORY_CARE_PROVIDER_SITE_OTHER): Payer: Commercial Managed Care - PPO

## 2017-09-14 DIAGNOSIS — J309 Allergic rhinitis, unspecified: Secondary | ICD-10-CM

## 2017-09-16 DIAGNOSIS — E1065 Type 1 diabetes mellitus with hyperglycemia: Secondary | ICD-10-CM | POA: Diagnosis not present

## 2017-09-16 DIAGNOSIS — E109 Type 1 diabetes mellitus without complications: Secondary | ICD-10-CM | POA: Diagnosis not present

## 2017-09-16 DIAGNOSIS — E1069 Type 1 diabetes mellitus with other specified complication: Secondary | ICD-10-CM | POA: Diagnosis not present

## 2017-09-29 DIAGNOSIS — K219 Gastro-esophageal reflux disease without esophagitis: Secondary | ICD-10-CM | POA: Diagnosis not present

## 2017-09-29 DIAGNOSIS — E109 Type 1 diabetes mellitus without complications: Secondary | ICD-10-CM | POA: Diagnosis not present

## 2017-10-02 ENCOUNTER — Ambulatory Visit (INDEPENDENT_AMBULATORY_CARE_PROVIDER_SITE_OTHER): Payer: Commercial Managed Care - PPO

## 2017-10-02 DIAGNOSIS — J309 Allergic rhinitis, unspecified: Secondary | ICD-10-CM

## 2017-10-07 DIAGNOSIS — J301 Allergic rhinitis due to pollen: Secondary | ICD-10-CM | POA: Diagnosis not present

## 2017-10-16 ENCOUNTER — Ambulatory Visit (INDEPENDENT_AMBULATORY_CARE_PROVIDER_SITE_OTHER): Payer: Commercial Managed Care - PPO

## 2017-10-16 DIAGNOSIS — J309 Allergic rhinitis, unspecified: Secondary | ICD-10-CM

## 2017-11-05 ENCOUNTER — Ambulatory Visit (INDEPENDENT_AMBULATORY_CARE_PROVIDER_SITE_OTHER): Payer: Commercial Managed Care - PPO | Admitting: *Deleted

## 2017-11-05 DIAGNOSIS — J309 Allergic rhinitis, unspecified: Secondary | ICD-10-CM | POA: Diagnosis not present

## 2017-11-26 DIAGNOSIS — D229 Melanocytic nevi, unspecified: Secondary | ICD-10-CM | POA: Diagnosis not present

## 2017-11-26 DIAGNOSIS — L814 Other melanin hyperpigmentation: Secondary | ICD-10-CM | POA: Diagnosis not present

## 2017-11-26 DIAGNOSIS — L821 Other seborrheic keratosis: Secondary | ICD-10-CM | POA: Diagnosis not present

## 2017-12-04 ENCOUNTER — Ambulatory Visit (INDEPENDENT_AMBULATORY_CARE_PROVIDER_SITE_OTHER): Payer: Commercial Managed Care - PPO

## 2017-12-04 DIAGNOSIS — J309 Allergic rhinitis, unspecified: Secondary | ICD-10-CM | POA: Diagnosis not present

## 2017-12-15 ENCOUNTER — Ambulatory Visit (INDEPENDENT_AMBULATORY_CARE_PROVIDER_SITE_OTHER): Payer: Commercial Managed Care - PPO | Admitting: *Deleted

## 2017-12-15 DIAGNOSIS — J309 Allergic rhinitis, unspecified: Secondary | ICD-10-CM

## 2017-12-22 DIAGNOSIS — E119 Type 2 diabetes mellitus without complications: Secondary | ICD-10-CM | POA: Diagnosis not present

## 2017-12-25 ENCOUNTER — Ambulatory Visit (INDEPENDENT_AMBULATORY_CARE_PROVIDER_SITE_OTHER): Payer: Commercial Managed Care - PPO | Admitting: *Deleted

## 2017-12-25 DIAGNOSIS — E109 Type 1 diabetes mellitus without complications: Secondary | ICD-10-CM | POA: Diagnosis not present

## 2017-12-25 DIAGNOSIS — J309 Allergic rhinitis, unspecified: Secondary | ICD-10-CM | POA: Diagnosis not present

## 2017-12-25 DIAGNOSIS — E1069 Type 1 diabetes mellitus with other specified complication: Secondary | ICD-10-CM | POA: Diagnosis not present

## 2017-12-25 DIAGNOSIS — E1065 Type 1 diabetes mellitus with hyperglycemia: Secondary | ICD-10-CM | POA: Diagnosis not present

## 2017-12-31 ENCOUNTER — Ambulatory Visit (INDEPENDENT_AMBULATORY_CARE_PROVIDER_SITE_OTHER): Payer: Commercial Managed Care - PPO | Admitting: *Deleted

## 2017-12-31 DIAGNOSIS — J309 Allergic rhinitis, unspecified: Secondary | ICD-10-CM | POA: Diagnosis not present

## 2018-01-05 ENCOUNTER — Ambulatory Visit (INDEPENDENT_AMBULATORY_CARE_PROVIDER_SITE_OTHER): Payer: Commercial Managed Care - PPO | Admitting: *Deleted

## 2018-01-05 DIAGNOSIS — J309 Allergic rhinitis, unspecified: Secondary | ICD-10-CM

## 2018-01-06 DIAGNOSIS — E1065 Type 1 diabetes mellitus with hyperglycemia: Secondary | ICD-10-CM | POA: Diagnosis not present

## 2018-01-06 DIAGNOSIS — E1069 Type 1 diabetes mellitus with other specified complication: Secondary | ICD-10-CM | POA: Diagnosis not present

## 2018-01-06 DIAGNOSIS — E109 Type 1 diabetes mellitus without complications: Secondary | ICD-10-CM | POA: Diagnosis not present

## 2018-01-12 DIAGNOSIS — E109 Type 1 diabetes mellitus without complications: Secondary | ICD-10-CM | POA: Diagnosis not present

## 2018-01-12 DIAGNOSIS — Z125 Encounter for screening for malignant neoplasm of prostate: Secondary | ICD-10-CM | POA: Diagnosis not present

## 2018-01-12 DIAGNOSIS — Z0000 Encounter for general adult medical examination without abnormal findings: Secondary | ICD-10-CM | POA: Diagnosis not present

## 2018-01-12 DIAGNOSIS — R82998 Other abnormal findings in urine: Secondary | ICD-10-CM | POA: Diagnosis not present

## 2018-01-19 DIAGNOSIS — Z0000 Encounter for general adult medical examination without abnormal findings: Secondary | ICD-10-CM | POA: Diagnosis not present

## 2018-01-19 DIAGNOSIS — Z1389 Encounter for screening for other disorder: Secondary | ICD-10-CM | POA: Diagnosis not present

## 2018-01-19 DIAGNOSIS — K219 Gastro-esophageal reflux disease without esophagitis: Secondary | ICD-10-CM | POA: Diagnosis not present

## 2018-01-19 DIAGNOSIS — E109 Type 1 diabetes mellitus without complications: Secondary | ICD-10-CM | POA: Diagnosis not present

## 2018-01-25 ENCOUNTER — Ambulatory Visit (INDEPENDENT_AMBULATORY_CARE_PROVIDER_SITE_OTHER): Payer: Commercial Managed Care - PPO | Admitting: *Deleted

## 2018-01-25 DIAGNOSIS — J309 Allergic rhinitis, unspecified: Secondary | ICD-10-CM | POA: Diagnosis not present

## 2018-02-04 ENCOUNTER — Ambulatory Visit (INDEPENDENT_AMBULATORY_CARE_PROVIDER_SITE_OTHER): Payer: Commercial Managed Care - PPO | Admitting: *Deleted

## 2018-02-04 DIAGNOSIS — J309 Allergic rhinitis, unspecified: Secondary | ICD-10-CM

## 2018-03-01 ENCOUNTER — Ambulatory Visit (INDEPENDENT_AMBULATORY_CARE_PROVIDER_SITE_OTHER): Payer: Commercial Managed Care - PPO

## 2018-03-01 DIAGNOSIS — J309 Allergic rhinitis, unspecified: Secondary | ICD-10-CM | POA: Diagnosis not present

## 2018-03-17 DIAGNOSIS — R972 Elevated prostate specific antigen [PSA]: Secondary | ICD-10-CM | POA: Diagnosis not present

## 2018-03-23 ENCOUNTER — Ambulatory Visit (INDEPENDENT_AMBULATORY_CARE_PROVIDER_SITE_OTHER): Payer: Commercial Managed Care - PPO | Admitting: *Deleted

## 2018-03-23 DIAGNOSIS — J309 Allergic rhinitis, unspecified: Secondary | ICD-10-CM

## 2018-03-30 DIAGNOSIS — E1069 Type 1 diabetes mellitus with other specified complication: Secondary | ICD-10-CM | POA: Diagnosis not present

## 2018-03-30 DIAGNOSIS — E1065 Type 1 diabetes mellitus with hyperglycemia: Secondary | ICD-10-CM | POA: Diagnosis not present

## 2018-03-30 DIAGNOSIS — E109 Type 1 diabetes mellitus without complications: Secondary | ICD-10-CM | POA: Diagnosis not present

## 2018-04-07 DIAGNOSIS — J301 Allergic rhinitis due to pollen: Secondary | ICD-10-CM | POA: Diagnosis not present

## 2018-04-07 NOTE — Progress Notes (Signed)
VIALS EXP 04-08-19

## 2018-04-14 ENCOUNTER — Ambulatory Visit (INDEPENDENT_AMBULATORY_CARE_PROVIDER_SITE_OTHER): Payer: Commercial Managed Care - PPO | Admitting: *Deleted

## 2018-04-14 DIAGNOSIS — J309 Allergic rhinitis, unspecified: Secondary | ICD-10-CM | POA: Diagnosis not present

## 2018-04-30 DIAGNOSIS — Z4681 Encounter for fitting and adjustment of insulin pump: Secondary | ICD-10-CM | POA: Diagnosis not present

## 2018-04-30 DIAGNOSIS — E109 Type 1 diabetes mellitus without complications: Secondary | ICD-10-CM | POA: Diagnosis not present

## 2018-05-14 ENCOUNTER — Ambulatory Visit (INDEPENDENT_AMBULATORY_CARE_PROVIDER_SITE_OTHER): Payer: Commercial Managed Care - PPO

## 2018-05-14 DIAGNOSIS — J309 Allergic rhinitis, unspecified: Secondary | ICD-10-CM

## 2018-05-14 DIAGNOSIS — E109 Type 1 diabetes mellitus without complications: Secondary | ICD-10-CM | POA: Diagnosis not present

## 2018-05-14 DIAGNOSIS — E1065 Type 1 diabetes mellitus with hyperglycemia: Secondary | ICD-10-CM | POA: Diagnosis not present

## 2018-05-14 DIAGNOSIS — E1069 Type 1 diabetes mellitus with other specified complication: Secondary | ICD-10-CM | POA: Diagnosis not present

## 2018-06-08 ENCOUNTER — Ambulatory Visit (INDEPENDENT_AMBULATORY_CARE_PROVIDER_SITE_OTHER): Payer: Commercial Managed Care - PPO | Admitting: *Deleted

## 2018-06-08 DIAGNOSIS — J309 Allergic rhinitis, unspecified: Secondary | ICD-10-CM | POA: Diagnosis not present

## 2018-06-17 ENCOUNTER — Ambulatory Visit (INDEPENDENT_AMBULATORY_CARE_PROVIDER_SITE_OTHER): Payer: Commercial Managed Care - PPO | Admitting: *Deleted

## 2018-06-17 DIAGNOSIS — J309 Allergic rhinitis, unspecified: Secondary | ICD-10-CM | POA: Diagnosis not present

## 2018-06-19 DIAGNOSIS — Z23 Encounter for immunization: Secondary | ICD-10-CM | POA: Diagnosis not present

## 2018-07-02 ENCOUNTER — Ambulatory Visit (INDEPENDENT_AMBULATORY_CARE_PROVIDER_SITE_OTHER): Payer: Commercial Managed Care - PPO | Admitting: *Deleted

## 2018-07-02 DIAGNOSIS — J309 Allergic rhinitis, unspecified: Secondary | ICD-10-CM | POA: Diagnosis not present

## 2018-07-12 ENCOUNTER — Ambulatory Visit (INDEPENDENT_AMBULATORY_CARE_PROVIDER_SITE_OTHER): Payer: Commercial Managed Care - PPO

## 2018-07-12 DIAGNOSIS — J309 Allergic rhinitis, unspecified: Secondary | ICD-10-CM

## 2018-07-23 ENCOUNTER — Ambulatory Visit (INDEPENDENT_AMBULATORY_CARE_PROVIDER_SITE_OTHER): Payer: Commercial Managed Care - PPO | Admitting: *Deleted

## 2018-07-23 DIAGNOSIS — J309 Allergic rhinitis, unspecified: Secondary | ICD-10-CM

## 2018-08-02 ENCOUNTER — Encounter (INDEPENDENT_AMBULATORY_CARE_PROVIDER_SITE_OTHER): Payer: Self-pay | Admitting: Orthopaedic Surgery

## 2018-08-02 ENCOUNTER — Ambulatory Visit (INDEPENDENT_AMBULATORY_CARE_PROVIDER_SITE_OTHER): Payer: Commercial Managed Care - PPO | Admitting: Orthopaedic Surgery

## 2018-08-02 ENCOUNTER — Ambulatory Visit (INDEPENDENT_AMBULATORY_CARE_PROVIDER_SITE_OTHER): Payer: Commercial Managed Care - PPO

## 2018-08-02 ENCOUNTER — Ambulatory Visit (INDEPENDENT_AMBULATORY_CARE_PROVIDER_SITE_OTHER): Payer: Commercial Managed Care - PPO | Admitting: *Deleted

## 2018-08-02 VITALS — BP 145/89 | HR 54 | Ht 72.0 in | Wt 167.0 lb

## 2018-08-02 DIAGNOSIS — M545 Low back pain, unspecified: Secondary | ICD-10-CM

## 2018-08-02 DIAGNOSIS — J309 Allergic rhinitis, unspecified: Secondary | ICD-10-CM | POA: Diagnosis not present

## 2018-08-02 NOTE — Progress Notes (Signed)
Office Visit Note   Patient: Stephen Marks           Date of Birth: Feb 05, 1966           MRN: [id number] Visit Date: 08/02/2018              Requested by: Haywood Pao, MD [address] [place], [place] [address] PCP: Haywood Pao, MD   Assessment & Plan: Visit Diagnoses:  1. Acute midline low back pain without sciatica     Plan: 1 month status post onset of upper lumbar back pain without radiculopathy.  Appears to be musculoligamentous.  Some degenerative disc changes at L5-S1 that do not appear to be symptomatic.  We will try a course of physical therapy and reevaluate in 4 to 6 weeks.  Consider MRI scan if still symptomatic  Follow-Up Instructions: Return if symptoms worsen or fail to improve.   Orders:  Orders Placed This Encounter  Procedures  . XR Lumbar Spine 2-3 Views  . Ambulatory referral to Physical Therapy   No orders of the defined types were placed in this encounter.     Procedures: No procedures performed   Clinical Data: No additional findings.   Subjective: Chief Complaint  Patient presents with  . New Patient (Initial Visit)    BACK PAIN FOR 4 WEEKS PT WAS RUNNING ON PAVEMENT WITH DOG AND GOT WORSE, THINKS MAY HAVE CAME FROM BEING PULLED BY LEASH  Stephen Marks relates relatively acute onset of midline upper lumbar back pain approximately 4 weeks ago.  He thinks it may be related to running with his dog the day before.  No other obvious injury or trauma.  He does not remember specific incident when he was running with the dog.  Pain is a little better now than it was.  Is fairly well localized to the upper lumbar region without referred discomfort distally.  There is no pain referred to the buttock or either lower extremity.  He does not have any numbness or tingling.  He denies any bowel or bladder changes.  Is not had any gastrointestinal issues.  He is diabetic but maintains his hemoglobin A1c. He does not have any significant pain when he  sits that is just over the course of day when he is in the car for a length of time.  Pain seems to be worse with back extension rather than with flexion.  HPI  Review of Systems  Constitutional: Negative for fatigue and fever.  HENT: Negative for ear pain.   Eyes: Negative for pain.  Respiratory: Negative for cough and shortness of breath.   Cardiovascular: Negative for leg swelling.  Gastrointestinal: Negative for constipation and diarrhea.  Genitourinary: Negative for difficulty urinating.  Musculoskeletal: Positive for back pain. Negative for neck pain.  Skin: Negative for rash.  Allergic/Immunologic: Negative for food allergies.  Neurological: Positive for weakness. Negative for numbness.  Hematological: Does not bruise/bleed easily.  Psychiatric/Behavioral: Negative for sleep disturbance.     Objective: Vital Signs: BP (!) 145/89 (BP Location: Left Arm, Patient Position: Sitting, Cuff Size: Normal)   Pulse (!) 54   Ht 6' (1.829 m)   Wt 167 lb (75.8 kg)   BMI 22.65 kg/m   Physical Exam  Constitutional: He is oriented to person, place, and time. He appears well-developed and well-nourished.  HENT:  Mouth/Throat: Oropharynx is clear and moist.  Eyes: Pupils are equal, round, and reactive to light. EOM are normal.  Pulmonary/Chest: Effort normal.  Neurological: He  is alert and oriented to person, place, and time.  Skin: Skin is warm and dry.  Psychiatric: He has a normal mood and affect. His behavior is normal.    Ortho Exam awake alert and oriented x3.  Comfortable sitting.  No obvious distress.  Walks without a limp.  Straight leg raise negative.  Reflexes are symmetrical.  Motor and sensory exam intact.  No percussible tenderness to lower lumbar spine or the upper lumbar spine.  With back extension is a little sore right around L1 or L2 in the midline.  No flank pain  Specialty Comments:  No specialty comments available.  Imaging: Xr Lumbar Spine 2-3 Views  Result  Date: 08/02/2018 The lumbar spine were obtained in 2 projections.  There is decrease in the disc space height between L5 and S1.  No listhesis.  No scoliosis.  No obvious facet sclerosis particularly in the upper lumbar spine where he appears to be symptomatic    PMFS History: Patient Active Problem List   Diagnosis Date Noted  . Asthma 06/06/2015  . Allergic rhinoconjunctivitis 06/06/2015   Past Medical History:  Diagnosis Date  . Diabetes mellitus without complication ([place])     Family History  Problem Relation Age of Onset  . Prostate cancer Father     Past Surgical History:  Procedure Laterality Date  . ABDOMINAL SURGERY     Social History   Occupational History  . Not on file  Tobacco Use  . Smoking status: Never Smoker  . Smokeless tobacco: Never Used  Substance and Sexual Activity  . Alcohol use: Yes    Comment: 1-2 WEEK  . Drug use: Not Currently  . Sexual activity: Not on file

## 2018-08-10 DIAGNOSIS — E109 Type 1 diabetes mellitus without complications: Secondary | ICD-10-CM | POA: Diagnosis not present

## 2018-08-10 DIAGNOSIS — N400 Enlarged prostate without lower urinary tract symptoms: Secondary | ICD-10-CM | POA: Diagnosis not present

## 2018-08-10 DIAGNOSIS — Z4681 Encounter for fitting and adjustment of insulin pump: Secondary | ICD-10-CM | POA: Diagnosis not present

## 2018-08-23 DIAGNOSIS — E1069 Type 1 diabetes mellitus with other specified complication: Secondary | ICD-10-CM | POA: Diagnosis not present

## 2018-08-23 DIAGNOSIS — E1065 Type 1 diabetes mellitus with hyperglycemia: Secondary | ICD-10-CM | POA: Diagnosis not present

## 2018-08-23 DIAGNOSIS — E109 Type 1 diabetes mellitus without complications: Secondary | ICD-10-CM | POA: Diagnosis not present

## 2018-08-25 DIAGNOSIS — R1013 Epigastric pain: Secondary | ICD-10-CM | POA: Diagnosis not present

## 2018-09-02 ENCOUNTER — Ambulatory Visit (INDEPENDENT_AMBULATORY_CARE_PROVIDER_SITE_OTHER): Payer: Commercial Managed Care - PPO | Admitting: *Deleted

## 2018-09-02 DIAGNOSIS — J309 Allergic rhinitis, unspecified: Secondary | ICD-10-CM | POA: Diagnosis not present

## 2018-09-03 DIAGNOSIS — E1065 Type 1 diabetes mellitus with hyperglycemia: Secondary | ICD-10-CM | POA: Diagnosis not present

## 2018-09-03 DIAGNOSIS — E1069 Type 1 diabetes mellitus with other specified complication: Secondary | ICD-10-CM | POA: Diagnosis not present

## 2018-09-03 DIAGNOSIS — E109 Type 1 diabetes mellitus without complications: Secondary | ICD-10-CM | POA: Diagnosis not present

## 2018-09-13 DIAGNOSIS — R1013 Epigastric pain: Secondary | ICD-10-CM | POA: Diagnosis not present

## 2018-09-21 DIAGNOSIS — R1013 Epigastric pain: Secondary | ICD-10-CM | POA: Diagnosis not present

## 2018-09-30 ENCOUNTER — Ambulatory Visit (INDEPENDENT_AMBULATORY_CARE_PROVIDER_SITE_OTHER): Payer: Commercial Managed Care - PPO

## 2018-09-30 DIAGNOSIS — J309 Allergic rhinitis, unspecified: Secondary | ICD-10-CM | POA: Diagnosis not present

## 2018-10-05 NOTE — Progress Notes (Signed)
EXP 10/06/19

## 2018-10-06 DIAGNOSIS — J3089 Other allergic rhinitis: Secondary | ICD-10-CM

## 2018-11-11 DIAGNOSIS — K219 Gastro-esophageal reflux disease without esophagitis: Secondary | ICD-10-CM | POA: Diagnosis not present

## 2018-11-11 DIAGNOSIS — E109 Type 1 diabetes mellitus without complications: Secondary | ICD-10-CM | POA: Diagnosis not present

## 2018-11-11 DIAGNOSIS — J302 Other seasonal allergic rhinitis: Secondary | ICD-10-CM | POA: Diagnosis not present

## 2018-11-12 ENCOUNTER — Ambulatory Visit (INDEPENDENT_AMBULATORY_CARE_PROVIDER_SITE_OTHER): Payer: Commercial Managed Care - PPO | Admitting: *Deleted

## 2018-11-12 DIAGNOSIS — J309 Allergic rhinitis, unspecified: Secondary | ICD-10-CM

## 2018-12-01 DIAGNOSIS — E1065 Type 1 diabetes mellitus with hyperglycemia: Secondary | ICD-10-CM | POA: Diagnosis not present

## 2018-12-01 DIAGNOSIS — E109 Type 1 diabetes mellitus without complications: Secondary | ICD-10-CM | POA: Diagnosis not present

## 2018-12-01 DIAGNOSIS — E1069 Type 1 diabetes mellitus with other specified complication: Secondary | ICD-10-CM | POA: Diagnosis not present

## 2018-12-03 DIAGNOSIS — E1065 Type 1 diabetes mellitus with hyperglycemia: Secondary | ICD-10-CM | POA: Diagnosis not present

## 2018-12-03 DIAGNOSIS — E109 Type 1 diabetes mellitus without complications: Secondary | ICD-10-CM | POA: Diagnosis not present

## 2018-12-03 DIAGNOSIS — E1069 Type 1 diabetes mellitus with other specified complication: Secondary | ICD-10-CM | POA: Diagnosis not present

## 2019-01-18 DIAGNOSIS — Z125 Encounter for screening for malignant neoplasm of prostate: Secondary | ICD-10-CM | POA: Diagnosis not present

## 2019-01-18 DIAGNOSIS — E109 Type 1 diabetes mellitus without complications: Secondary | ICD-10-CM | POA: Diagnosis not present

## 2019-01-18 DIAGNOSIS — Z0000 Encounter for general adult medical examination without abnormal findings: Secondary | ICD-10-CM | POA: Diagnosis not present

## 2019-01-20 DIAGNOSIS — E109 Type 1 diabetes mellitus without complications: Secondary | ICD-10-CM | POA: Diagnosis not present

## 2019-01-20 DIAGNOSIS — R82998 Other abnormal findings in urine: Secondary | ICD-10-CM | POA: Diagnosis not present

## 2019-01-28 ENCOUNTER — Telehealth: Payer: Self-pay | Admitting: *Deleted

## 2019-01-28 ENCOUNTER — Ambulatory Visit: Payer: Self-pay | Admitting: *Deleted

## 2019-01-28 NOTE — Telephone Encounter (Signed)
Patient last received his allergy injections 11/12/2018 at .30mL every 3 weeks. Patient is 8 weeks late beyond the 3 week mark. Please advise dosage to be given.

## 2019-01-31 ENCOUNTER — Ambulatory Visit: Payer: Self-pay

## 2019-01-31 ENCOUNTER — Ambulatory Visit (INDEPENDENT_AMBULATORY_CARE_PROVIDER_SITE_OTHER): Payer: Commercial Managed Care - PPO | Admitting: Allergy and Immunology

## 2019-01-31 ENCOUNTER — Encounter: Payer: Self-pay | Admitting: Allergy and Immunology

## 2019-01-31 ENCOUNTER — Other Ambulatory Visit: Payer: Self-pay

## 2019-01-31 DIAGNOSIS — J301 Allergic rhinitis due to pollen: Secondary | ICD-10-CM

## 2019-01-31 DIAGNOSIS — J3089 Other allergic rhinitis: Secondary | ICD-10-CM | POA: Diagnosis not present

## 2019-01-31 DIAGNOSIS — J309 Allergic rhinitis, unspecified: Secondary | ICD-10-CM

## 2019-01-31 MED ORDER — MONTELUKAST SODIUM 10 MG PO TABS: ORAL_TABLET | ORAL | 11 refills | Status: AC

## 2019-01-31 NOTE — Telephone Encounter (Signed)
Called patient and advised of next dose for injection. A televisit has been scheduled for the patient with Dr. Neldon Mc. Patient verbalized understanding.

## 2019-01-31 NOTE — Telephone Encounter (Signed)
Patient has not been seen in over 3 years.  Needs an OV.  Can restart bed 0.3, then 0.4, then back to 0.5.

## 2019-01-31 NOTE — Progress Notes (Signed)
[facility] [place] [facility] [place]   Follow-up Note  Referring Provider: Tisovec, Fransico Him, MD Primary Provider: Haywood Pao, MD Date of Office Visit: 01/31/2019  Subjective:   Stephen Marks (DOB: 1966-07-07) is a 53 y.o. male who returns to the [facility] on 01/31/2019 in re-evaluation of the following:  HPI: This is a E-med visit requested by patient who is located at home.  [facility] is followed in this clinic for asthma and allergic rhinitis.  He was last seen in this clinic over 4 years ago.  He continues on immunotherapy directed against house dust mite and grass and tree.  From the medical record it appears as though his last administration of immunotherapy directed against aeroallergens was on 12 November 2018.  At that point he was using full dose immunotherapy extract every 3 weeks.  Because of a coronavirus pandemic induced logistical problem he has not received any injections since that point in time  No SABA use in years. Can bike ride without a problem. No refill of SABA in years.   Had a total of 5 years of immunotherapy. Has worked good especially for seasonal flares while using montelukast and flonase and cetirizine. Usually uses montelukast and Flonase spring and fall.. No sinus infections.    Obtained flu vaccine very year  Allergies as of 01/31/2019      Reactions   Penicillins Hives   Sulfa Antibiotics       Medication List    atorvastatin 10 MG tablet Commonly known as:  LIPITOR Take 10 mg by mouth daily.   cetirizine 10 MG tablet Commonly known as:  ZYRTEC Take 10 mg by mouth daily.   escitalopram 20 MG tablet Commonly known as:  LEXAPRO   FLAXSEED OIL PO Take by mouth.   fluticasone 50 MCG/ACT nasal spray Commonly known as:  FLONASE Place into both nostrils daily.   insulin lispro 100 UNIT/ML injection Commonly known as:  HUMALOG Inject into the skin 3 (three) times daily before meals.    montelukast 10 MG tablet Commonly known as:  SINGULAIR Take 10 mg by mouth as needed.   multivitamin tablet Take 1 tablet by mouth daily.   ramipril 10 MG capsule Commonly known as:  ALTACE Take 10 mg by mouth daily.       Past Medical History:  Diagnosis Date  . Diabetes mellitus without complication [facility])     Past Surgical History:  Procedure Laterality Date  . ABDOMINAL SURGERY    . EYE SURGERY      Review of systems negative except as noted in HPI / PMHx or noted below:  Review of Systems  Constitutional: Negative.   HENT: Negative.   Eyes: Negative.   Respiratory: Negative.   Cardiovascular: Negative.   Gastrointestinal: Negative.   Genitourinary: Negative.   Musculoskeletal: Negative.   Skin: Negative.   Neurological: Negative.   Endo/Heme/Allergies: Negative.   Psychiatric/Behavioral: Negative.      Objective:   There were no vitals filed for this visit.        Physical Exam-deferred  Diagnostics: none   Assessment and Plan:   1. Perennial allergic rhinitis   2. Seasonal allergic rhinitis due to pollen     1.  Continue montelukast 10 mg tablet 1 time per day during periods of upper airway symptoms  2.  Continue OTC Flonase 1-2 sprays each nostril per day during periods of upper airway symptoms  3.  Continue OTC  antihistamine if needed  4.  Return to clinic in 1 year or earlier if problem  Dorsel appears to be doing quite well and we will discontinue his immunotherapy as he has had a total of 5 years if not longer of therapy and has had a good response.  Hopefully he will be a long-term responder as do 80% of people in his circumstance.  He can remain on montelukast during spring and fall and a nasal steroid during this time of the year as well and we will see him back in this clinic in 1 year or earlier if there is a problem.  Total patient interaction time 15 minutes      Allena Katz, MD Allergy / [facility]

## 2019-01-31 NOTE — Telephone Encounter (Signed)
Called and left voicemail asking patient to return call to discuss. Patient will need to be advised that he needs an office visit either telemed, mychart video, or in office.

## 2019-01-31 NOTE — Patient Instructions (Signed)
  1.  Continue montelukast 10 mg tablet 1 time per day during periods of upper airway symptoms  2.  Continue OTC Flonase 1-2 sprays each nostril per day during periods of upper airway symptoms  3.  Continue OTC antihistamine if needed  4.  Return to clinic in 1 year or earlier if problem

## 2019-02-01 ENCOUNTER — Encounter: Payer: Self-pay | Admitting: Allergy and Immunology

## 2019-02-03 NOTE — Progress Notes (Signed)
Patient is at home.  Provider is in the office.  Start time: 3:04 pm End time: 3:25 pm Verbal consent given to file insurance.

## 2019-07-21 ENCOUNTER — Other Ambulatory Visit: Payer: Self-pay

## 2019-07-21 DIAGNOSIS — Z20822 Contact with and (suspected) exposure to covid-19: Secondary | ICD-10-CM

## 2019-07-22 LAB — NOVEL CORONAVIRUS, NAA: SARS-CoV-2, NAA: NOT DETECTED

## 2019-11-17 ENCOUNTER — Encounter: Payer: Self-pay | Admitting: Orthopaedic Surgery

## 2019-11-17 ENCOUNTER — Other Ambulatory Visit: Payer: Self-pay

## 2019-11-17 ENCOUNTER — Ambulatory Visit (INDEPENDENT_AMBULATORY_CARE_PROVIDER_SITE_OTHER): Payer: Commercial Managed Care - PPO | Admitting: Orthopaedic Surgery

## 2019-11-17 ENCOUNTER — Ambulatory Visit: Payer: Self-pay

## 2019-11-17 VITALS — Ht 71.0 in | Wt 170.0 lb

## 2019-11-17 DIAGNOSIS — M545 Low back pain, unspecified: Secondary | ICD-10-CM | POA: Insufficient documentation
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
## 2019-11-26 ENCOUNTER — Ambulatory Visit: Payer: Commercial Managed Care - PPO

## 2019-11-29 ENCOUNTER — Ambulatory Visit: Payer: Commercial Managed Care - PPO | Admitting: Physical Therapy

## 2019-12-07 ENCOUNTER — Ambulatory Visit: Payer: Commercial Managed Care - PPO | Admitting: Physical Therapy

## 2019-12-23 ENCOUNTER — Other Ambulatory Visit: Payer: Self-pay

## 2019-12-23 ENCOUNTER — Ambulatory Visit (INDEPENDENT_AMBULATORY_CARE_PROVIDER_SITE_OTHER): Payer: Commercial Managed Care - PPO | Admitting: Rehabilitative and Restorative Service Providers"

## 2019-12-23 ENCOUNTER — Encounter: Payer: Self-pay | Admitting: Rehabilitative and Restorative Service Providers"

## 2019-12-23 DIAGNOSIS — G8929 Other chronic pain: Secondary | ICD-10-CM

## 2019-12-23 DIAGNOSIS — R262 Difficulty in walking, not elsewhere classified: Secondary | ICD-10-CM

## 2019-12-23 DIAGNOSIS — M545 Low back pain, unspecified: Secondary | ICD-10-CM

## 2019-12-23 NOTE — Patient Instructions (Signed)
Access Code: [email or web address] URL: [email or web address] Date: 12/23/2019 Prepared by: Scot Jun  Exercises Supine Bridge - 2 x daily - 7 x weekly - 10 reps - 3 sets - 2 hold Supine Hamstring Stretch with Strap - 1 x daily - 7 x weekly - 10 reps - 3 sets Prone Press Up - 2 x daily - 7 x weekly - 10 reps - 3 sets - 1-2 hold Standing Lumbar Extension - 2 x daily - 7 x weekly - 10 reps - 2-3 sets Supine Lower Trunk Rotation - 2 x daily - 7 x weekly - 5 reps - 1 sets - 15 hold

## 2019-12-23 NOTE — Therapy (Signed)
[facility] Physical Therapy [address] [place], [place], [address] Phone: [phone]   Fax:  [phone]  Physical Therapy Evaluation  Patient Details  Name: Stephen Marks MRN: [id number] Date of Birth: May 02, 1966 Referring Provider (PT): Dr. Durward Fortes   Encounter Date: 12/23/2019  PT End of Session - 12/23/19 1012    Visit Number  1    Number of Visits  12    Date for PT Re-Evaluation  02/03/20    PT Start Time  0930    PT Stop Time  1010    PT Time Calculation (min)  40 min    Activity Tolerance  Patient tolerated treatment well    Behavior During Therapy  [facility] for tasks assessed/performed       Past Medical History:  Diagnosis Date  . Diabetes mellitus without complication [facility])     Past Surgical History:  Procedure Laterality Date  . ABDOMINAL SURGERY    . EYE SURGERY      There were no vitals filed for this visit.   Subjective Assessment - 12/23/19 0936    Subjective  Pt. indicated several years ago back pain started and he saw Dr. Durward Fortes at that time.  Pt. stated previous imaging showed DDD L5/S1.  Pt. indicated off and on insidious onset of symptoms over time since then.  Pt. stated pain starts and lasts various durations during episodes.  Pt. stated this past week having pain increase from sailing.    Limitations  Lifting;Standing    Diagnostic tests  xrays in past medical histroy    Patient Stated Goals  Reduce pain, return to recretaional activity (biking, sailing, etc)    Currently in Pain?  Yes    Pain Score  1    pain at worst  4/10   Pain Location  Back    Pain Orientation  Lower    Pain Descriptors / Indicators  Tightness;Aching    Pain Type  Chronic pain    Pain Radiating Towards  Denied    Pain Onset  More than a month ago    Pain Frequency  Intermittent    Aggravating Factors   Lifting, bending forward, standing up after inactivity.    Pain Relieving Factors  Some stretching, working on posture    Effect of Pain on  Daily Activities  Does not prevent activity but causes some trouble the next day/after.         [facility] PT Assessment - 12/23/19 0001      Assessment   Medical Diagnosis  Low back pain    Referring Provider (PT)  Dr. Durward Fortes    Onset Date/Surgical Date  11/06/19      Precautions   Precautions  None      Restrictions   Weight Bearing Restrictions  No      Balance Screen   Has the patient fallen in the past 6 months  Yes    How many times?  [place]   Has the patient had a decrease in activity level because of a fear of falling?   Yes   due to pain   Is the patient reluctant to leave their home because of a fear of falling?   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  Reduced upon return.  REIS x 5 :  Mild increase in ERP, movement to 100% but limits at L4, L5    Lumbar - Right Rotation  movement to Rt knee    Lumbar - Left Rotation  Movement to Lt knee joint, concordant pain      Strength   Strength Assessment Site  Hip;Knee;Ankle    Right/Left Hip  Left;Right    Right Hip Flexion  5/5    Left Hip Flexion  5/5    Right/Left Knee  Left;Right    Right Knee Flexion  5/5    Right Knee Extension  5/5    Left Knee Flexion  5/5    Left Knee Extension  5/5    Right/Left Ankle  Left;Right     Right Ankle Dorsiflexion  5/5    Left Ankle Dorsiflexion  5/5      Palpation   SI assessment   cPA limitation L3, L4, L5                Objective measurements completed on examination: See above findings.      [facility] Adult PT Treatment/Exercise - 12/23/19 0001      Exercises   Exercises  Lumbar      Lumbar Exercises: Stretches   Passive Hamstring Stretch  Left;Right;3 reps   15 seconds   Lower Trunk Rotation  5 reps;Other (comment)   15 sec x 5 bilateral   Standing Extension  10 reps    Press Ups  3 reps;10 reps      Lumbar Exercises: Supine   Bridge  20 reps      Manual Therapy   Manual therapy comments  G3 L2-L5 cPA             PT Education - 12/23/19 1001    Education Details  HEP, POC    Person(s) Educated  Patient    Methods  Explanation;Verbal cues;Demonstration    Comprehension  Verbalized understanding;Returned demonstration          PT Long Term Goals - 12/23/19 1013      PT LONG TERM GOAL #1   Title  Patient will demonstrate/report pain at worst less than or equal to 2/10 to facilitate minimal limitation in daily activity secondary to pain symptoms.    Time  6    Period  Weeks    Status  New    Target Date  02/03/20      PT LONG TERM GOAL #2   Title  Patient will demonstrate independent use of home exercise program to facilitate ability to maintain/progress functional gains from skilled physical therapy services.    Time  6    Period  Weeks    Status  New    Target Date  02/03/20      PT LONG TERM GOAL #3  Title  Patient will demonstrate return to work/recreational activity at previous level of function without limitations secondary due to condition    Time  6    Period  Weeks    Status  New    Target Date  02/03/20      PT LONG TERM GOAL #4   Title  Pt. will demonstrate lumbar mobility ext 100 % WFL, lateral flexion s symptoms to facilitate upright standing, walking, bending at PLOF s restriction.    Time  6    Period   Weeks    Status  New    Target Date  02/03/20      PT LONG TERM GOAL #5   Title  Pt. will demonstrate/report patient specific functional scale avg > 8/10 to indicate minimal disability.    Time  6    Period  Weeks    Status  New    Target Date  02/03/20             Plan - 12/23/19 1002    Clinical Impression Statement  Patient is a 54 y.o. male who comes to clinic with complaints of low back pain with mobility deficits that impair his ability to perform usual daily and recreational functional activities without increase difficulty/symptoms at this time.  Patient to benefit from skilled PT services to address impairments and limitations to improve to previous level of function without restriction secondary to condition.    Examination-Activity Limitations  Bend;Carry;Lift;Stand    Examination-Participation Restrictions  Community Activity;Other   recreational activity   Stability/Clinical Decision Making  Stable/Uncomplicated    Clinical Decision Making  Low    Rehab Potential  Good    PT Frequency  2x / week    PT Duration  6 weeks    PT Treatment/Interventions  ADLs/Self Care Home Management;Electrical Stimulation;Ultrasound;Traction;Moist Heat;Iontophoresis 4mg /ml Dexamethasone;Gait training;Stair training;Patient/family education;Functional mobility training;Therapeutic activities;Therapeutic exercise;Balance training;Neuromuscular re-education;Manual techniques;Dry needling;Passive range of motion;Taping;Spinal Manipulations;Joint Manipulations    PT Next Visit Plan  Continue to improve mobility of lumbar spine.    PT Home Exercise Plan  [id number]    Consulted and Agree with Plan of Care  Patient       Patient will benefit from skilled therapeutic intervention in order to improve the following deficits and impairments:  Decreased range of motion, Difficulty walking, Decreased activity tolerance, Pain, Hypomobility, Decreased mobility  Visit Diagnosis: Chronic midline low  back pain without sciatica - Plan: PT plan of care cert/re-cert  Difficulty in walking, not elsewhere classified - Plan: PT plan of care cert/re-cert     Problem List Patient Active Problem List   Diagnosis Date Noted  . Low back pain 11/17/2019  . Asthma 06/06/2015  . Allergic rhinoconjunctivitis 06/06/2015    Scot Jun, PT, DPT, OCS, ATC 12/23/19  10:42 AM    [facility] Physical Therapy [address] [place], [place], [address] Phone: [phone]   Fax:  [phone]  Name: Stephen Marks MRN: [id number] Date of Birth: 01-24-1966

## 2019-12-28 ENCOUNTER — Encounter: Payer: Commercial Managed Care - PPO | Admitting: Rehabilitative and Restorative Service Providers"

## 2020-01-03 ENCOUNTER — Encounter: Payer: Commercial Managed Care - PPO | Admitting: Rehabilitative and Restorative Service Providers"

## 2020-01-03 ENCOUNTER — Telehealth: Payer: Self-pay | Admitting: Rehabilitative and Restorative Service Providers"

## 2020-01-03 NOTE — Telephone Encounter (Signed)
Called Pt. After 15 mins no show for appointment.  Pt. Indicated he forgot the appointment due to busy day.  Will plan to attend Friday.  Scot Jun, PT, DPT, OCS, ATC 01/03/20  12:04 PM

## 2020-01-06 ENCOUNTER — Encounter: Payer: Self-pay | Admitting: Rehabilitative and Restorative Service Providers"

## 2020-01-06 ENCOUNTER — Ambulatory Visit (INDEPENDENT_AMBULATORY_CARE_PROVIDER_SITE_OTHER): Payer: Commercial Managed Care - PPO | Admitting: Rehabilitative and Restorative Service Providers"

## 2020-01-06 ENCOUNTER — Other Ambulatory Visit: Payer: Self-pay

## 2020-01-06 DIAGNOSIS — R262 Difficulty in walking, not elsewhere classified: Secondary | ICD-10-CM | POA: Diagnosis not present

## 2020-01-06 DIAGNOSIS — M545 Low back pain, unspecified: Secondary | ICD-10-CM

## 2020-01-06 DIAGNOSIS — G8929 Other chronic pain: Secondary | ICD-10-CM | POA: Diagnosis not present

## 2020-01-06 NOTE — Therapy (Signed)
[facility] Physical Therapy [address] [place], [place], [address] Phone: [phone]   Fax:  [phone]  Physical Therapy Treatment  Patient Details  Name: Stephen Marks MRN: [id number] Date of Birth: 08-23-1966 Referring Provider (PT): Dr. Durward Fortes   Encounter Date: 01/06/2020  PT End of Session - 01/06/20 1158    Visit Number  2    Number of Visits  12    Date for PT Re-Evaluation  02/03/20    PT Start Time  [id number]    PT Stop Time  1223    PT Time Calculation (min)  41 min    Activity Tolerance  Patient tolerated treatment well    Behavior During Therapy  [facility] for tasks assessed/performed       Past Medical History:  Diagnosis Date  . Diabetes mellitus without complication [facility])     Past Surgical History:  Procedure Laterality Date  . ABDOMINAL SURGERY    . EYE SURGERY      There were no vitals filed for this visit.  Subjective Assessment - 01/06/20 1157    Subjective  Pt. indicated feeling some improvement in mobility and symptoms overall since last visit.  Still no painfree all the time but getting improvements.    Limitations  Lifting;Standing    Diagnostic tests  xrays in past medical histroy    Patient Stated Goals  Reduce pain, return to recretaional activity (biking, sailing, etc)    Currently in Pain?  No/denies    Pain Score  0-No pain    Pain Location  Back    Pain Orientation  Lower    Pain Descriptors / Indicators  Tightness    Pain Type  Chronic pain    Pain Onset  More than a month ago    Pain Frequency  Occasional    Aggravating Factors   movement after inactivity.                       [facility] Adult PT Treatment/Exercise - 01/06/20 0001      Lumbar Exercises: Stretches   Passive Hamstring Stretch  Left;Right;3 reps   c contralateral slr 3 x 10   Lower Trunk Rotation  5 reps;Other (comment)    Press Ups  3 reps;10 reps    Piriformis Stretch  3 reps   15 seconds x 5     Lumbar Exercises: Supine   Bridge  20 reps      Manual Therapy   Manual therapy comments  G3 L2-L5 cPA             PT Education - 01/06/20 1144    Education Details  Intervention cues    Person(s) Educated  Patient    Methods  Explanation;Demonstration;Verbal cues    Comprehension  Verbalized understanding;Returned demonstration          PT Long Term Goals - 12/23/19 1013      PT LONG TERM GOAL #1   Title  Patient will demonstrate/report pain at worst less than or equal to 2/10 to facilitate minimal limitation in daily activity secondary to pain symptoms.    Time  6    Period  Weeks    Status  New    Target Date  02/03/20      PT LONG TERM GOAL #2   Title  Patient will demonstrate independent use of home exercise program to facilitate ability to maintain/progress functional gains from skilled physical therapy services.    Time  6  Period  Weeks    Status  New    Target Date  02/03/20      PT LONG TERM GOAL #3   Title  Patient will demonstrate return to work/recreational activity at previous level of function without limitations secondary due to condition    Time  6    Period  Weeks    Status  New    Target Date  02/03/20      PT LONG TERM GOAL #4   Title  Pt. will demonstrate lumbar mobility ext 100 % WFL, lateral flexion s symptoms to facilitate upright standing, walking, bending at PLOF s restriction.    Time  6    Period  Weeks    Status  New    Target Date  02/03/20      PT LONG TERM GOAL #5   Title  Pt. will demonstrate/report patient specific functional scale avg > 8/10 to indicate minimal disability.    Time  6    Period  Weeks    Status  New    Target Date  02/03/20            Plan - 01/06/20 1207    Clinical Impression Statement  Mobility gains noted in lumbar spine compared to evaluation.  Positive benefit noted from manual intervention for mobility gains.  Reduced frequency recommended for in clinic due to progress and Pt. schedule.    Examination-Activity  Limitations  Bend;Carry;Lift;Stand    Examination-Participation Restrictions  Community Activity;Other   recreational activity   Stability/Clinical Decision Making  Stable/Uncomplicated    Rehab Potential  Good    PT Frequency  2x / week    PT Duration  6 weeks    PT Treatment/Interventions  ADLs/Self Care Home Management;Electrical Stimulation;Ultrasound;Traction;Moist Heat;Iontophoresis 4mg /ml Dexamethasone;Gait training;Stair training;Patient/family education;Functional mobility training;Therapeutic activities;Therapeutic exercise;Balance training;Neuromuscular re-education;Manual techniques;Dry needling;Passive range of motion;Taping;Spinal Manipulations;Joint Manipulations    PT Next Visit Plan  Continue to improve mobility of lumbar spine.    PT Home Exercise Plan  [id number]    Consulted and Agree with Plan of Care  Patient       Patient will benefit from skilled therapeutic intervention in order to improve the following deficits and impairments:  Decreased range of motion, Difficulty walking, Decreased activity tolerance, Pain, Hypomobility, Decreased mobility  Visit Diagnosis: Chronic midline low back pain without sciatica  Difficulty in walking, not elsewhere classified     Problem List Patient Active Problem List   Diagnosis Date Noted  . Low back pain 11/17/2019  . Asthma 06/06/2015  . Allergic rhinoconjunctivitis 06/06/2015   Scot Jun, PT, DPT, OCS, ATC 01/06/20  12:13 PM    [facility] Physical Therapy [address] [place], [place], [address] Phone: [phone]   Fax:  [phone]  Name: SATYA HEADY MRN: [id number] Date of Birth: May 08, 1966

## 2020-01-11 ENCOUNTER — Encounter: Payer: Commercial Managed Care - PPO | Admitting: Rehabilitative and Restorative Service Providers"

## 2020-01-13 ENCOUNTER — Other Ambulatory Visit: Payer: Self-pay

## 2020-01-13 ENCOUNTER — Ambulatory Visit (INDEPENDENT_AMBULATORY_CARE_PROVIDER_SITE_OTHER): Payer: Commercial Managed Care - PPO | Admitting: Rehabilitative and Restorative Service Providers"

## 2020-01-13 ENCOUNTER — Encounter: Payer: Self-pay | Admitting: Rehabilitative and Restorative Service Providers"

## 2020-01-13 DIAGNOSIS — M545 Low back pain, unspecified: Secondary | ICD-10-CM

## 2020-01-13 DIAGNOSIS — R262 Difficulty in walking, not elsewhere classified: Secondary | ICD-10-CM | POA: Diagnosis not present

## 2020-01-13 DIAGNOSIS — G8929 Other chronic pain: Secondary | ICD-10-CM | POA: Diagnosis not present

## 2020-01-13 NOTE — Therapy (Signed)
[facility] Physical Therapy [address] [place], [place], [address] Phone: [phone]   Fax:  [phone]  Physical Therapy Treatment  Patient Details  Name: Stephen Marks MRN: [id number] Date of Birth: 04-20-1966 Referring Provider (PT): Dr. Durward Fortes   Encounter Date: 01/13/2020  PT End of Session - 01/13/20 0937    Visit Number  3    Number of Visits  12    Date for PT Re-Evaluation  02/03/20    PT Start Time  0931    PT Stop Time  0955    PT Time Calculation (min)  24 min    Activity Tolerance  Patient tolerated treatment well    Behavior During Therapy  [facility] for tasks assessed/performed       Past Medical History:  Diagnosis Date  . Diabetes mellitus without complication [facility])     Past Surgical History:  Procedure Laterality Date  . ABDOMINAL SURGERY    . EYE SURGERY      There were no vitals filed for this visit.  Subjective Assessment - 01/13/20 0935    Subjective  Pt. stated feeling continued improvement in symptoms.  Pt. stated pain at worst 4/10, sometimes c lifting.  Pt. stated not full committent on HEP since last visit.    Limitations  Lifting;Standing    Diagnostic tests  xrays in past medical histroy    Patient Stated Goals  Reduce pain, return to recretaional activity (biking, sailing, etc)    Currently in Pain?  No/denies    Pain Score  0-No pain    Pain Location  Back    Pain Orientation  Lower    Pain Descriptors / Indicators  Tightness    Pain Type  Chronic pain    Pain Onset  More than a month ago    Pain Frequency  Occasional    Aggravating Factors   lifting heavy items                       [facility] Adult PT Treatment/Exercise - 01/13/20 0001      Lumbar Exercises: Stretches   Passive Hamstring Stretch  Left;Right;5 reps   15 seconds bilateral   Lower Trunk Rotation  5 reps;Other (comment)   15 seconds bilateral   Press Ups  3 reps;10 reps    Piriformis Stretch  --      Lumbar Exercises: Supine    Bridge  20 reps      Manual Therapy   Manual therapy comments  G3 L2-L5 cPA, regional sidelying manip g5 bilateral x2                   PT Long Term Goals - 12/23/19 1013      PT LONG TERM GOAL #1   Title  Patient will demonstrate/report pain at worst less than or equal to 2/10 to facilitate minimal limitation in daily activity secondary to pain symptoms.    Time  6    Period  Weeks    Status  New    Target Date  02/03/20      PT LONG TERM GOAL #2   Title  Patient will demonstrate independent use of home exercise program to facilitate ability to maintain/progress functional gains from skilled physical therapy services.    Time  6    Period  Weeks    Status  New    Target Date  02/03/20      PT LONG TERM GOAL #3  Title  Patient will demonstrate return to work/recreational activity at previous level of function without limitations secondary due to condition    Time  6    Period  Weeks    Status  New    Target Date  02/03/20      PT LONG TERM GOAL #4   Title  Pt. will demonstrate lumbar mobility ext 100 % WFL, lateral flexion s symptoms to facilitate upright standing, walking, bending at PLOF s restriction.    Time  6    Period  Weeks    Status  New    Target Date  02/03/20      PT LONG TERM GOAL #5   Title  Pt. will demonstrate/report patient specific functional scale avg > 8/10 to indicate minimal disability.    Time  6    Period  Weeks    Status  New    Target Date  02/03/20            Plan - 01/13/20 0948    Clinical Impression Statement  Conitnued improvement reported overall and mobility of lumbar spine approaching 100 % WFL s symptoms as observed in clinic.  Recommended continued HEP performance for management and return in 2 weeks for reassessment.  Visit shortened today 2/2 Pt. request.    Examination-Activity Limitations  Bend;Carry;Lift;Stand    Examination-Participation Restrictions  Community Activity;Other   recreational activity    Stability/Clinical Decision Making  Stable/Uncomplicated    Rehab Potential  Good    PT Frequency  2x / week    PT Duration  6 weeks    PT Treatment/Interventions  ADLs/Self Care Home Management;Electrical Stimulation;Ultrasound;Traction;Moist Heat;Iontophoresis 4mg /ml Dexamethasone;Gait training;Stair training;Patient/family education;Functional mobility training;Therapeutic activities;Therapeutic exercise;Balance training;Neuromuscular re-education;Manual techniques;Dry needling;Passive range of motion;Taping;Spinal Manipulations;Joint Manipulations    PT Next Visit Plan  HEP for mobility, reassess results on next visit for possible d/c transition.    PT Home Exercise Plan  [id number]    Consulted and Agree with Plan of Care  Patient       Patient will benefit from skilled therapeutic intervention in order to improve the following deficits and impairments:  Decreased range of motion, Difficulty walking, Decreased activity tolerance, Pain, Hypomobility, Decreased mobility  Visit Diagnosis: Chronic midline low back pain without sciatica  Difficulty in walking, not elsewhere classified     Problem List Patient Active Problem List   Diagnosis Date Noted  . Low back pain 11/17/2019  . Asthma 06/06/2015  . Allergic rhinoconjunctivitis 06/06/2015   Scot Jun, PT, DPT, OCS, ATC 01/13/20  9:53 AM    [facility] Physical Therapy [address] [place], [place], [address] Phone: [phone]   Fax:  [phone]  Name: Stephen Marks MRN: [id number] Date of Birth: Jan 21, 1966

## 2020-01-17 ENCOUNTER — Encounter: Payer: Commercial Managed Care - PPO | Admitting: Rehabilitative and Restorative Service Providers"

## 2020-01-20 ENCOUNTER — Telehealth: Payer: Self-pay | Admitting: Rehabilitative and Restorative Service Providers"

## 2020-01-20 ENCOUNTER — Encounter: Payer: Commercial Managed Care - PPO | Admitting: Rehabilitative and Restorative Service Providers"

## 2020-01-20 NOTE — Telephone Encounter (Signed)
Called and LVM after 15 mins no show for appointment.  Reminded about next week May 14th appointment.  Scot Jun, PT, DPT, OCS, ATC 01/20/20  9:48 AM

## 2020-01-24 ENCOUNTER — Encounter: Payer: Commercial Managed Care - PPO | Admitting: Rehabilitative and Restorative Service Providers"

## 2020-01-27 ENCOUNTER — Ambulatory Visit (INDEPENDENT_AMBULATORY_CARE_PROVIDER_SITE_OTHER): Payer: Commercial Managed Care - PPO | Admitting: Rehabilitative and Restorative Service Providers"

## 2020-01-27 ENCOUNTER — Other Ambulatory Visit: Payer: Self-pay

## 2020-01-27 DIAGNOSIS — M545 Low back pain: Secondary | ICD-10-CM

## 2020-01-27 DIAGNOSIS — G8929 Other chronic pain: Secondary | ICD-10-CM

## 2020-01-27 DIAGNOSIS — R262 Difficulty in walking, not elsewhere classified: Secondary | ICD-10-CM | POA: Diagnosis not present
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
## 2020-03-26 ENCOUNTER — Ambulatory Visit (INDEPENDENT_AMBULATORY_CARE_PROVIDER_SITE_OTHER): Payer: Commercial Managed Care - PPO | Admitting: Rehabilitative and Restorative Service Providers"

## 2020-03-26 ENCOUNTER — Other Ambulatory Visit: Payer: Self-pay

## 2020-03-26 ENCOUNTER — Encounter: Payer: Self-pay | Admitting: Rehabilitative and Restorative Service Providers"

## 2020-03-26 DIAGNOSIS — R262 Difficulty in walking, not elsewhere classified: Secondary | ICD-10-CM | POA: Diagnosis not present

## 2020-03-26 DIAGNOSIS — M25552 Pain in left hip: Secondary | ICD-10-CM | POA: Diagnosis not present

## 2020-03-26 DIAGNOSIS — M545 Low back pain, unspecified: Secondary | ICD-10-CM

## 2020-03-26 DIAGNOSIS — M25551 Pain in right hip: Secondary | ICD-10-CM

## 2020-03-26 DIAGNOSIS — G8929 Other chronic pain: Secondary | ICD-10-CM

## 2020-03-26 NOTE — Therapy (Addendum)
[facility] Physical Therapy [address] [place], [place], [address] Phone: [phone]   Fax:  [phone]  Physical Therapy Treatment/Progress Note/Recertification Discharge  Patient Details  Name: Stephen Marks MRN: [id number] Date of Birth: 03-Dec-1965 Referring Provider (PT): Dr. Durward Fortes   Encounter Date: 03/26/2020   Progress Note Reporting Period 12/23/2019 to 03/26/2020  See note below for Objective Data and Assessment of Progress/Goals.        PT End of Session - 03/26/20 1057    Visit Number 5    Number of Visits 12    Date for PT Re-Evaluation 05/07/20    PT Start Time 1100    PT Stop Time 1140    PT Time Calculation (min) 40 min    Activity Tolerance Patient tolerated treatment well    Behavior During Therapy WFL for tasks assessed/performed           Past Medical History:  Diagnosis Date  . Diabetes mellitus without complication [facility])     Past Surgical History:  Procedure Laterality Date  . ABDOMINAL SURGERY    . EYE SURGERY      There were no vitals filed for this visit.   Subjective Assessment - 03/26/20 1108    Subjective Pt. returned to clinic today c complaints of occasional back pain but primarily lateral hip complaints that occurred over course of last month or so.  Noted tightness/ pain that improved c rest and foam roller activity.    Limitations Lifting;Standing    Diagnostic tests xrays in past medical histroy    Patient Stated Goals Reduce pain, return to recretaional activity (biking, sailing, etc)    Currently in Pain? Yes    Pain Score 7    at worst (hips)   Pain Location Back   bilateral hips   Pain Orientation Left;Right    Pain Descriptors / Indicators Sore;Tender    Pain Type Chronic pain    Pain Onset More than a month ago    Pain Frequency Intermittent    Aggravating Factors  biking, stretching soreness, tenderness to touch    Pain Relieving Factors Improved with rest              [facility] PT  Assessment - 03/26/20 0001      Assessment   Medical Diagnosis Low back pain    Referring Provider (PT) Dr. 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, has standing desk    Leisure Biking, sailing, walking      Cognition   Overall Cognitive Status Within Functional Limits for tasks assessed      Observation/Other Assessments   Observations --      AROM   Lumbar Flexion movement to ankles, no pain    Lumbar Extension 100% WFL, no complaints    Lumbar - Right Rotation movement to Rt knee    Lumbar - Left Rotation movements to Lt knee      Strength   Right Hip Flexion 5/5    Right Hip ABduction 4+/5    Left Hip Flexion 5/5    Left Hip ABduction 4/5    Right Knee Flexion 5/5    Right Knee Extension 5/5    Left Knee Flexion 5/5    Left Knee  Extension 5/5      Palpation   Palpation comment TrP glute med bilateral near insertion to greater trochanter                         [facility] Adult PT Treatment/Exercise - 03/26/20 0001      Lumbar Exercises: Supine   Bridge 20 reps    Other Supine Lumbar Exercises SKC to opposite shoulder 30 sec x 3 bilateral      Lumbar Exercises: Sidelying   Other Sidelying Lumbar Exercises sidelying clam shell 4 x 10 bilateral       Manual Therapy   Manual therapy comments active compression c movement to Lt and Rt glute med c movement                  PT Education - 03/26/20 1125    Education Details HEP adjustment    Person(s) Educated Patient    Methods Explanation;Demonstration    Comprehension Verbalized understanding;Returned demonstration               PT Long Term Goals - 03/26/20 1125      PT LONG TERM GOAL #1   Title Patient will demonstrate/report pain at worst less than or equal to 2/10 to facilitate  minimal limitation in daily activity secondary to pain symptoms.    Time 6    Period Weeks    Status On-going    Target Date 05/07/20      PT LONG TERM GOAL #2   Title Patient will demonstrate independent use of home exercise program to facilitate ability to maintain/progress functional gains from skilled physical therapy services.    Time 6    Period Weeks    Status Partially Met    Target Date 05/07/20      PT LONG TERM GOAL #3   Title Patient will demonstrate return to work/recreational activity at previous level of function without limitations secondary due to condition    Time 6    Period Weeks    Status Partially Met    Target Date 05/07/20      PT LONG TERM GOAL #4   Title Pt. will demonstrate lumbar mobility ext 100 % WFL, lateral flexion s symptoms to facilitate upright standing, walking, bending at PLOF s restriction.    Time 6    Period Weeks    Status Achieved    Target Date 05/07/20      PT LONG TERM GOAL #5   Title Pt. will demonstrate/report patient specific functional scale avg > 8/10 to indicate minimal disability.    Time 6    Period Weeks    Status On-going    Target Date 05/07/20                 Plan - 03/26/20 1058    Clinical Impression Statement Pt. returned to clinic today c complaints of low back pain, bilateral hip pain c mild mobility/strength deficits that can impair daily activity and recreational activity at PLOF.  See objective data for updated for updated information.  Pt. may continue to benefit from skilled PT services to facilitate progression towards full return to PLOF.    Examination-Activity Limitations Bend;Carry;Lift;Stand    Examination-Participation Restrictions Community Activity;Other   recreational activity   Stability/Clinical Decision Making Stable/Uncomplicated    Rehab Potential Good    PT Frequency --   1-2x/week   PT Duration 6 weeks    PT Treatment/Interventions ADLs/Self Care Home  Management;Electrical  Stimulation;Ultrasound;Traction;Moist Heat;Iontophoresis '4mg'$ /ml Dexamethasone;Gait training;Stair training;Patient/family education;Functional mobility training;Therapeutic activities;Therapeutic exercise;Balance training;Neuromuscular re-education;Manual techniques;Dry needling;Passive range of motion;Taping;Spinal Manipulations;Joint Manipulations    PT Next Visit Plan Reassess use of new HEP, manual intervention for mobility gains back/hip.    PT Home Exercise Plan [id number]    Consulted and Agree with Plan of Care Patient           Patient will benefit from skilled therapeutic intervention in order to improve the following deficits and impairments:  Decreased range of motion, Difficulty walking, Decreased activity tolerance, Pain, Hypomobility, Decreased mobility  Visit Diagnosis: Chronic midline low back pain without sciatica  Difficulty in walking, not elsewhere classified  Pain in right hip  Pain in left hip     Problem List Patient Active Problem List   Diagnosis Date Noted  . Low back pain 11/17/2019  . Asthma 06/06/2015  . Allergic rhinoconjunctivitis 06/06/2015   Scot Jun, PT, DPT, OCS, ATC 03/26/20  11:39 AM  PHYSICAL THERAPY DISCHARGE SUMMARY  Visits from Start of Care: 5  Current functional level related to goals / functional outcomes: seenote   Remaining deficits: See note   Education / Equipment: HEP Plan: Patient agrees to discharge.  Patient goals were partially met. Patient is being discharged due to not returning since the last visit.  ?????    Scot Jun, PT, DPT, OCS, ATC 07/02/20  8:26 AM      [facility] Physical Therapy [address] [place], [place], [address] Phone: [phone]   Fax:  [phone]  Name: Stephen Marks MRN: [id number] Date of Birth: March 18, 1966

## 2020-04-18 ENCOUNTER — Encounter: Payer: Commercial Managed Care - PPO | Admitting: Rehabilitative and Restorative Service Providers"

## 2020-08-14 ENCOUNTER — Other Ambulatory Visit: Payer: Self-pay

## 2020-08-14 ENCOUNTER — Ambulatory Visit (INDEPENDENT_AMBULATORY_CARE_PROVIDER_SITE_OTHER): Payer: Commercial Managed Care - PPO | Admitting: Ophthalmology

## 2020-08-14 ENCOUNTER — Encounter (INDEPENDENT_AMBULATORY_CARE_PROVIDER_SITE_OTHER): Payer: Self-pay | Admitting: Ophthalmology

## 2020-08-14 DIAGNOSIS — E109 Type 1 diabetes mellitus without complications: Secondary | ICD-10-CM | POA: Insufficient documentation

## 2020-08-14 DIAGNOSIS — H2513 Age-related nuclear cataract, bilateral: Secondary | ICD-10-CM | POA: Insufficient documentation

## 2020-08-14 NOTE — Assessment & Plan Note (Signed)

## 2020-08-14 NOTE — Assessment & Plan Note (Signed)

## 2020-08-14 NOTE — Progress Notes (Signed)
08/14/2020     CHIEF COMPLAINT Patient presents for Retina Follow Up   HISTORY OF PRESENT ILLNESS: Stephen Marks is a 54 y.o. male who presents to the clinic today for:   HPI    Retina Follow Up    Patient presents with  Diabetic Retinopathy.  In both eyes.  This started 1 year ago.  Severity is mild.  Duration of 1 year.  Since onset it is stable.          Comments    1 Year Diabetic Type I F/U OU  Pt denies noticeable changes to [place] OU since last visit. Pt denies ocular pain, flashes of light, or floaters OU. Pt sts he is wearing older glasses bc of double VA issues with new glasses RX OS. A1c: 6.5, 04/2020 LBS: 91 this AM       Last edited by Stephen Marks, [place] on 08/14/2020  8:08 AM. (History)      Referring physician: Haywood Pao, MD [place],  [place] [address]  HISTORICAL INFORMATION:   Selected notes from the [facility]: No current outpatient medications on file. (Ophthalmic Drugs)   No current facility-administered medications for this visit. (Ophthalmic Drugs)   Current Outpatient Medications (Other)  Medication Sig  . atorvastatin (LIPITOR) 10 MG tablet Take 10 mg by mouth daily.  . cetirizine (ZYRTEC) 10 MG tablet Take 10 mg by mouth daily.  Marland Kitchen escitalopram (LEXAPRO) 20 MG tablet   . Flaxseed, Linseed, (FLAXSEED OIL PO) Take by mouth.  . fluticasone (FLONASE) 50 MCG/ACT nasal spray Place into both nostrils daily.  . insulin lispro (HUMALOG) 100 UNIT/ML injection Inject into the skin 3 (three) times daily before meals.  . montelukast (SINGULAIR) 10 MG tablet Can take one tablet by mouth once daily as directed.  . Multiple Vitamin (MULTIVITAMIN) tablet Take 1 tablet by mouth daily.  . ramipril (ALTACE) 10 MG capsule Take 10 mg by mouth daily.   No current facility-administered medications for this visit. (Other)      REVIEW OF SYSTEMS:    ALLERGIES Allergies  Allergen Reactions  .  Penicillins Hives  . Sulfa Antibiotics     PAST MEDICAL HISTORY Past Medical History:  Diagnosis Date  . Diabetes mellitus without complication [facility])    Past Surgical History:  Procedure Laterality Date  . ABDOMINAL SURGERY    . EYE SURGERY      FAMILY HISTORY Family History  Problem Relation Age of Onset  . Prostate cancer Father     SOCIAL HISTORY Social History   Tobacco Use  . Smoking status: Never Smoker  . Smokeless tobacco: Never Used  Vaping Use  . Vaping Use: Never used  Substance Use Topics  . Alcohol use: Yes    Comment: 1-2 WEEK  . Drug use: Not Currently         OPHTHALMIC EXAM: Base Eye Exam    Visual Acuity (ETDRS)      Right Left   Dist cc 20/20 20/20 -1   Correction: Glasses       Tonometry (Tonopen, 8:08 AM)      Right Left   Pressure 15 14       Pupils      Pupils Dark Light Shape React APD   Right PERRL 4 3 Round Brisk None   Left PERRL 4 3 Round Brisk None       Visual Fields (Counting fingers)  Left Right    Full Full       Extraocular Movement      Right Left    Full Full       Neuro/Psych    Oriented x3: Yes   Mood/Affect: Normal       Dilation    Both eyes: 1.0% Mydriacyl, 2.5% Phenylephrine @ 8:12 AM        Slit Lamp and Fundus Exam    External Exam      Right Left   External Normal Normal       Slit Lamp Exam      Right Left   Lids/Lashes Normal Normal   Conjunctiva/Sclera White and quiet White and quiet   Cornea Clear Clear   Anterior Chamber Deep and quiet Deep and quiet   Iris Round and reactive Round and reactive   Lens Trace Nuclear sclerosis Trace Nuclear sclerosis   Anterior Vitreous Normal Normal       Fundus Exam      Right Left   Posterior Vitreous Normal Normal   Disc Normal Normal   C/D Ratio 0.25 0.3   Macula Normal Normal   Vessels no DR no DR   Periphery Normal Normal          IMAGING AND PROCEDURES  Imaging and Procedures for 08/14/20             ASSESSMENT/PLAN:  Diabetes mellitus type I ([place]) The patient has diabetes without any evidence of retinopathy. The patient advised to maintain good blood glucose control, excellent blood pressure control, and favorable levels of cholesterol, low density lipoprotein, and high density lipoproteins. Follow up in 1 year was recommended. Explained that fluctuations in visual acuity , or "out of focus", may result from large variations of blood sugar control.  Nuclear sclerotic cataract of both eyes The nature of cataract was discussed with the patient as well as the elective nature of surgery. The patient was reassured that surgery at a later date does not put the patient at risk for a worse outcome. It was emphasized that the need for surgery is dictated by the patient's quality of life as influenced by the cataract. Patient was instructed to maintain close follow up with their general eye care doctor.      ICD-10-CM   1. Type 1 diabetes mellitus without complication (HCC)  F02.7   2. Nuclear sclerotic cataract of both eyes  H25.13     1.  No interval change over the last 1 year, no diabetic retinopathy visible on clinical examination.  2.  3.  Ophthalmic Meds Ordered this visit:  No orders of the defined types were placed in this encounter.      Return in about 1 year (around 08/14/2021) for COLOR FP, DILATE OU.  There are no Patient Instructions on file for this visit.   Explained the diagnoses, plan, and follow up with the patient and they expressed understanding.  Patient expressed understanding of the importance of proper follow up care.   Stephen Marks Branch M.D. Diseases & Surgery of the Retina and Vitreous [facility]ina [facility] [place] 08/14/20     Abbreviations: M myopia (nearsighted); A astigmatism; H hyperopia (farsighted); P presbyopia; Mrx spectacle prescription;  CTL contact lenses; OD right eye; OS left eye; OU both eyes  XT exotropia; ET esotropia; PEK punctate  epithelial keratitis; PEE punctate epithelial erosions; DES dry eye syndrome; MGD meibomian gland dysfunction; ATs artificial tears; PFAT's preservative free artificial tears; [place] nuclear sclerotic  cataract; PSC posterior subcapsular cataract; ERM epi-retinal membrane; PVD posterior vitreous detachment; RD retinal detachment; DM diabetes mellitus; DR diabetic retinopathy; NPDR non-proliferative diabetic retinopathy; PDR proliferative diabetic retinopathy; CSME clinically significant macular edema; DME diabetic macular edema; dbh dot blot hemorrhages; CWS cotton wool spot; POAG primary open angle glaucoma; C/D cup-to-disc ratio; HVF humphrey visual field; GVF goldmann visual field; OCT optical coherence tomography; IOP intraocular pressure; BRVO Branch retinal vein occlusion; CRVO central retinal vein occlusion; CRAO central retinal artery occlusion; BRAO branch retinal artery occlusion; RT retinal tear; SB scleral buckle; PPV pars plana vitrectomy; VH Vitreous hemorrhage; PRP panretinal laser photocoagulation; IVK intravitreal kenalog; VMT vitreomacular traction; MH Macular hole;  NVD neovascularization of the disc; NVE neovascularization elsewhere; AREDS age related eye disease study; ARMD age related macular degeneration; POAG primary open angle glaucoma; EBMD epithelial/anterior basement membrane dystrophy; ACIOL anterior chamber intraocular lens; IOL intraocular lens; PCIOL posterior chamber intraocular lens; Phaco/IOL phacoemulsification with intraocular lens placement; [place] photorefractive keratectomy; LASIK laser assisted in situ keratomileusis; HTN hypertension; DM diabetes mellitus; COPD chronic obstructive pulmonary disease

## 2020-10-31 ENCOUNTER — Ambulatory Visit: Payer: Commercial Managed Care - PPO | Admitting: Orthopaedic Surgery

## 2020-11-08 ENCOUNTER — Ambulatory Visit (INDEPENDENT_AMBULATORY_CARE_PROVIDER_SITE_OTHER): Payer: Commercial Managed Care - PPO | Admitting: Orthopaedic Surgery

## 2020-11-08 ENCOUNTER — Encounter: Payer: Self-pay | Admitting: Orthopaedic Surgery

## 2020-11-08 ENCOUNTER — Ambulatory Visit: Payer: Self-pay

## 2020-11-08 ENCOUNTER — Other Ambulatory Visit: Payer: Self-pay

## 2020-11-08 VITALS — Ht 71.0 in | Wt 170.0 lb

## 2020-11-08 DIAGNOSIS — G8929 Other chronic pain: Secondary | ICD-10-CM

## 2020-11-08 DIAGNOSIS — M545 Low back pain, unspecified: Secondary | ICD-10-CM | POA: Diagnosis not present

## 2020-11-08 DIAGNOSIS — M25512 Pain in left shoulder: Secondary | ICD-10-CM | POA: Diagnosis not present

## 2020-11-08 NOTE — Progress Notes (Signed)
Office Visit Note   Patient: Stephen Marks           Date of Birth: 1966/08/02           MRN: [id number] Visit Date: 11/08/2020              Requested by: Haywood Pao, MD [address] [place],  [place] [address] PCP: Haywood Pao, MD   Assessment & Plan: Visit Diagnoses:  1. Chronic left shoulder pain   2. Chronic midline low back pain without sciatica     Plan: Insidious onset of left shoulder pain for at least a year. He is quite active in hiking and mountain biking and pain may be related to those activities. No specific injury or trauma. He has mild impingement testing on the extreme of external rotation and probably has a mild subacromial bursitis. Films are negative. We will try a course of physical therapy. Stephen Marks is an insulin-dependent diabetic and I am hesitant to give him cortisone. In addition he has been experiencing some thoracic pain he has been through a course of therapy for his low back and notes it made a big difference. We will try therapy course for both the shoulder and the thoracic spine pain plan to see him back in a month or so if no improvement  Follow-Up Instructions: Return if symptoms worsen or fail to improve.   Orders:  Orders Placed This Encounter  Procedures  . XR Shoulder Left  . Ambulatory referral to Physical Therapy   No orders of the defined types were placed in this encounter.     Procedures: No procedures performed   Clinical Data: No additional findings.   Subjective: Chief Complaint  Patient presents with  . Left Shoulder - Pain  Patient presents today for left shoulder pain. He said that it has been hurting for a year. No certain injury, but does recall falling from his mountain bike and landing on his left side last year. He states that his pain is located at his lateral aspect. He states that his pain is worse with activity. No numbness or tingling. He has noticed some improvement. He is taking Ibuprofen. He is  left hand dominant.  Recently seen for problems referable to his low back. He tried a course of physical therapy that made a huge difference. He has had some discomfort now in the thoracic spine and would like to try some physical therapy for that as well. No numbness or tingling. Type 1 diabetes with an insulin pump  HPI  Review of Systems   Objective: Vital Signs: Ht 5\' 11"  (1.803 m)   Wt 170 lb (77.1 kg)   BMI 23.71 kg/m   Physical Exam Constitutional:      Appearance: He is well-developed and well-nourished.  HENT:     Mouth/Throat:     Mouth: Oropharynx is clear and moist.  Eyes:     Extraocular Movements: EOM normal.     Pupils: Pupils are equal, round, and reactive to light.  Pulmonary:     Effort: Pulmonary effort is normal.  Skin:    General: Skin is warm and dry.  Neurological:     Mental Status: He is alert and oriented to person, place, and time.  Psychiatric:        Mood and Affect: Mood and affect normal.        Behavior: Behavior normal.     Ortho Exam left shoulder with full quick overhead motion. Mild pain  with external rotation in the impingement position. No popping or clicking. No pain at the [facility] joint. Little bit of discomfort in the anterior lateral subacromial region. No pain at the [facility] joint. Biceps intact. Good strength. Good grip and release. Neurologically intact. No pain with abduction. Speed sign negative  Specialty Comments:  No specialty comments available.  Imaging: XR Shoulder Left  Result Date: 11/08/2020 Films of the left shoulder obtained in several projections. The humeral head is centered about the glenoid. Normal space between the humeral head and the acromium. No AC joint arthritis. No obvious pathology about the acromion. No ectopic calcification or acute change.    PMFS History: Patient Active Problem List   Diagnosis Date Noted  . Pain in left shoulder 11/08/2020  . Diabetes mellitus type I ([place]) 08/14/2020  . Nuclear  sclerotic cataract of both eyes 08/14/2020  . Low back pain 11/17/2019  . Asthma 06/06/2015  . Allergic rhinoconjunctivitis 06/06/2015   Past Medical History:  Diagnosis Date  . Diabetes mellitus without complication ([place])     Family History  Problem Relation Age of Onset  . Prostate cancer Father     Past Surgical History:  Procedure Laterality Date  . ABDOMINAL SURGERY    . EYE SURGERY     Social History   Occupational History  . Not on file  Tobacco Use  . Smoking status: Never Smoker  . Smokeless tobacco: Never Used  Vaping Use  . Vaping Use: Never used  Substance and Sexual Activity  . Alcohol use: Yes    Comment: 1-2 WEEK  . Drug use: Not Currently  . Sexual activity: Not on file

## 2020-12-12 ENCOUNTER — Encounter: Payer: Self-pay | Admitting: Rehabilitative and Restorative Service Providers"

## 2020-12-12 ENCOUNTER — Other Ambulatory Visit: Payer: Self-pay

## 2020-12-12 ENCOUNTER — Ambulatory Visit (INDEPENDENT_AMBULATORY_CARE_PROVIDER_SITE_OTHER): Payer: Commercial Managed Care - PPO | Admitting: Rehabilitative and Restorative Service Providers"

## 2020-12-12 DIAGNOSIS — M25512 Pain in left shoulder: Secondary | ICD-10-CM | POA: Diagnosis not present

## 2020-12-12 DIAGNOSIS — M25612 Stiffness of left shoulder, not elsewhere classified: Secondary | ICD-10-CM | POA: Diagnosis not present

## 2020-12-12 DIAGNOSIS — M6281 Muscle weakness (generalized): Secondary | ICD-10-CM

## 2020-12-12 DIAGNOSIS — G8929 Other chronic pain: Secondary | ICD-10-CM | POA: Diagnosis not present

## 2020-12-12 NOTE — Therapy (Signed)
[facility] [address] [place], [place], [address] Phone: [phone]   Fax:  [phone]  Physical Therapy Evaluation  Patient Details  Name: Stephen Marks MRN: [id number] Date of Birth: 1966/07/18 Referring Provider (PT): Stephen Balding MD   Encounter Date: 12/12/2020   PT End of Session - 12/12/20 1451    Visit Number 1    Number of Visits 12    Date for PT Re-Evaluation 01/11/21    PT Start Time 1300    PT Stop Time 1345    PT Time Calculation (min) 45 min    Activity Tolerance Patient tolerated treatment well    Behavior During Therapy [facility] for tasks assessed/performed           Past Medical History:  Diagnosis Date  . Diabetes mellitus without complication [facility])     Past Surgical History:  Procedure Laterality Date  . ABDOMINAL SURGERY    . EYE SURGERY      There were no vitals filed for this visit.    Subjective Assessment - 12/12/20 1446    Subjective Stephen Marks has had L shoulder pain off and on for years.  He took a spill off his mountain bike and it has been aggravated ever since.  Dr. Durward Marks diagnosed him with an impingement and mentioned an MRI may be ordered depending on progress with physical therapy.    Pertinent History Diabetic (Type 1), previous L shoulder surgery as a child to treat an infection    Limitations Other (comment)   Limits participation in mountain biking and other leisure pursuits.  Unable to lean on L UE and sometimes bothers him at night.   Diagnostic tests X-Ray    Patient Stated Goals Get back to normal activities without restriction.    Currently in Pain? Yes    Pain Score 3     Pain Location Shoulder    Pain Orientation Left    Pain Descriptors / Indicators Aching;Constant;Sore;Tightness    Pain Type Chronic pain    Pain Radiating Towards NA    Pain Onset More than a month ago    Pain Frequency Constant    Aggravating Factors  Mountain biking, putting weight through the L shoulder  and sometimes at night    Effect of Pain on Daily Activities Limits participation in normal activities    Multiple Pain Sites No              [facility] PT Assessment - 12/12/20 0001      Assessment   Medical Diagnosis L shoulder impingement and upper back pain    Referring Provider (PT) Stephen Balding MD    Onset Date/Surgical Date --   Chronic     Restrictions   Other Position/Activity Restrictions Stephen Marks does note increased pain when leaning on the L shoulder or riding a mountain bike      Balance Screen   Has the patient fallen in the past 6 months No    Has the patient had a decrease in activity level because of a fear of falling?  No    Is the patient reluctant to leave their home because of a fear of falling?  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[employer], computer work, writing    Leisure [employer] biking      Cognition   Overall Cognitive Status Within Functional Limits for tasks assessed  Observation/Other Assessments   Focus on Therapeutic Outcomes (FOTO)  59 (Goal 73)      ROM / Strength   AROM / PROM / Strength AROM;Strength      AROM   Overall AROM  Deficits    AROM Assessment Site Shoulder    Right/Left Shoulder Left;Right    Right Shoulder Flexion 170 Degrees    Right Shoulder Internal Rotation 50 Degrees    Right Shoulder External Rotation 85 Degrees    Right Shoulder Horizontal  ADduction 40 Degrees    Left Shoulder Flexion 160 Degrees    Left Shoulder Internal Rotation 45 Degrees    Left Shoulder External Rotation 85 Degrees    Left Shoulder Horizontal ADduction 40 Degrees      Strength   Overall Strength Deficits    Strength Assessment Site Shoulder    Right/Left Shoulder Left;Right    Right Shoulder Internal Rotation --   38 pounds   Right Shoulder External Rotation --   24 pounds   Left Shoulder Internal Rotation --   23 pounds   Left Shoulder External  Rotation --   7 pounds                     Objective measurements completed on examination: See above findings.       [employer] Adult PT Treatment/Exercise - 12/12/20 0001      Exercises   Exercises Shoulder      Shoulder Exercises: Supine   Protraction Strengthening;Left;20 reps;Weights    Protraction Weight (lbs) 5#    Protraction Limitations Reach up and punch at top    Internal Rotation AROM;Left;10 reps;Other (comment)    Internal Rotation Limitations 10 seconds      Shoulder Exercises: Seated   Retraction Strengthening;Both;10 reps;Other (comment)    Retraction Limitations 5 seconds shoulder blade pinches      Shoulder Exercises: Standing   External Rotation Strengthening;Left;10 reps;Theraband;Other (comment)    Theraband Level (Shoulder External Rotation) Level 2 (Red)    External Rotation Limitations 3 seconds 2 sets towel roll under elbow    Internal Rotation Strengthening;Left;10 reps;Theraband;Other (comment)    Internal Rotation Limitations 3 seconds towel roll under elbow                  PT Education - 12/12/20 1450    Education Details Discussed impingement (bursitis, tendonitis, tear), shoulder anatomy, exam findings and plan of care.  Began starter HEP.    Person(s) Educated Patient    Methods Explanation;Demonstration;Tactile cues;Verbal cues;Handout    Comprehension Verbal cues required;Returned demonstration;Need further instruction;Tactile cues required;Verbalized understanding            PT Short Term Goals - 12/12/20 1458      PT SHORT TERM GOAL #1   Title Stephen Marks will improve L shoulder AROM for flexion to 170; ER to 90 and IR to 60 degrees.    Baseline 160, 85 and 45 respectively    Time 4    Period Weeks    Status New    Target Date 01/11/21             PT Long Term Goals - 12/12/20 1459      PT LONG TERM GOAL #1   Title Improve FOTO score to 73.    Baseline 59    Time 6    Period Weeks    Status New    Target  Date 01/23/21      PT LONG TERM GOAL #2  Title Improve L shoulder ER strength to 21 pounds and IR to 30 pounds.    Baseline 7 and 23 respectively    Time 6    Period Weeks    Status New    Target Date 01/23/21      PT LONG TERM GOAL #3   Title Stephen Marks will be independent with his long-term HEP at DC.    Time 6    Period Weeks    Status New    Target Date 01/23/21                  Plan - 12/12/20 1452    Clinical Impression Statement Stephen Marks presents with chronic L shoulder pain and significant previous L shoulder surgery (to treat an infection).  He has limitations in AROM (particularly posterior capsule flexibility) and significant RTC strength impairments (particularly ER).  Impingement is present and he has signs and symptoms consistent with a possible RTC tear.  We will work on improving AROM, scapular and RTC strength and RA in 4 weeks.  If significant strength and subjective progress are not noted, Stephen Marks will be referred back to Dr. Durward Marks for additional testing.    Personal Factors and Comorbidities Comorbidity 1    Comorbidities Previous L shoulder surgery as a child, significant L shoulder weakness    Examination-Activity Limitations Sleep;Reach Overhead;Carry    Examination-Participation Restrictions Community Activity    Stability/Clinical Decision Making Stable/Uncomplicated    Clinical Decision Making Low    Rehab Potential Good    PT Frequency Other (comment)   1-2X/week   PT Duration 6 weeks    PT Treatment/Interventions ADLs/Self Care Home Management;Moist Heat;Iontophoresis 4mg /ml Dexamethasone;Electrical Stimulation;Cryotherapy;Therapeutic activities;Therapeutic exercise;Neuromuscular re-education;Patient/family education;Manual techniques;Vasopneumatic Device    PT Next Visit Plan Capsular stretching, scapular and RTC strengthening    PT Home Exercise Plan Access Code: [email or web address]    Consulted and Agree with Plan of Care Patient           Patient  will benefit from skilled therapeutic intervention in order to improve the following deficits and impairments:  Decreased activity tolerance,Decreased endurance,Decreased range of motion,Decreased strength,Increased edema,Impaired flexibility,Impaired UE functional use,Pain  Visit Diagnosis: Muscle weakness (generalized)  Stiffness of left shoulder, not elsewhere classified  Chronic left shoulder pain     Problem List Patient Active Problem List   Diagnosis Date Noted  . Pain in left shoulder 11/08/2020  . Diabetes mellitus type I ([place]) 08/14/2020  . Nuclear sclerotic cataract of both eyes 08/14/2020  . Low back pain 11/17/2019  . Asthma 06/06/2015  . Allergic rhinoconjunctivitis 06/06/2015    Farley Ly PT, MPT 12/12/2020, 3:02 PM  [facility] Physical Therapy [address] [place], [place], [address] Phone: [phone]   Fax:  [phone]  Name: Stephen Marks MRN: [id number] Date of Birth: [birth year]

## 2020-12-12 NOTE — Patient Instructions (Signed)
Access Code: [email or web address] URL: [email or web address] Date: 12/12/2020 Prepared by: Vista Mink  Exercises Standing Scapular Retraction - 5 x daily - 7 x weekly - 1 sets - 5 reps - 5 second hold Supine Shoulder Internal Rotation - 2 x daily - 7 x weekly - 1 sets - 10 reps - 10 seconds hold Shoulder Internal Rotation with Resistance - 1 x daily - 7 x weekly - 2-3 sets - 10 reps - 3seconds hold Shoulder External Rotation with Anchored Resistance with Towel Under Elbow - 1 x daily - 3 x weekly - 2-3 sets - 10 reps - 3 hold Supine Scapular Protraction in Flexion with Dumbbells - 2 x daily - 7 x weekly - 1 sets - 20 reps - 3 seconds hold

## 2020-12-17 ENCOUNTER — Encounter: Payer: Self-pay | Admitting: Rehabilitative and Restorative Service Providers"

## 2020-12-17 ENCOUNTER — Ambulatory Visit (INDEPENDENT_AMBULATORY_CARE_PROVIDER_SITE_OTHER): Payer: Commercial Managed Care - PPO | Admitting: Rehabilitative and Restorative Service Providers"

## 2020-12-17 ENCOUNTER — Other Ambulatory Visit: Payer: Self-pay

## 2020-12-17 DIAGNOSIS — G8929 Other chronic pain: Secondary | ICD-10-CM | POA: Diagnosis not present

## 2020-12-17 DIAGNOSIS — M25512 Pain in left shoulder: Secondary | ICD-10-CM | POA: Diagnosis not present

## 2020-12-17 DIAGNOSIS — M6281 Muscle weakness (generalized): Secondary | ICD-10-CM

## 2020-12-17 DIAGNOSIS — M25612 Stiffness of left shoulder, not elsewhere classified: Secondary | ICD-10-CM

## 2020-12-17 NOTE — Therapy (Signed)
[facility] Physical Therapy [address] [place], [place], [address] Phone: [phone]   Fax:  [phone]  Physical Therapy Treatment  Patient Details  Name: Stephen Marks MRN: [id number] Date of Birth: 08/23/1966 Referring Provider (PT): Garald Balding MD   Encounter Date: 12/17/2020   PT End of Session - 12/17/20 1857    Visit Number 2    Number of Visits 12    Date for PT Re-Evaluation 01/11/21    PT Start Time 7591    PT Stop Time 1100    PT Time Calculation (min) 45 min    Activity Tolerance Patient tolerated treatment well;No increased pain;Patient limited by fatigue    Behavior During Therapy [facility] for tasks assessed/performed           Past Medical History:  Diagnosis Date  . Diabetes mellitus without complication [facility])     Past Surgical History:  Procedure Laterality Date  . ABDOMINAL SURGERY    . EYE SURGERY      There were no vitals filed for this visit.   Subjective Assessment - 12/17/20 1854    Subjective Stephen Marks reports good early HEP compliance.  He has progressed some with his supine arm raises strength.  He will be back in 2 weeks after a trip to [place].    Pertinent History Diabetic (Type 1), previous L shoulder surgery as a child to treat an infection    Limitations Other (comment)   Limits participation in mountain biking and other leisure pursuits.  Unable to lean on L UE and sometimes bothers him at night.   Diagnostic tests X-Ray    Patient Stated Goals Get back to normal activities without restriction.    Currently in Pain? Yes    Pain Score 3     Pain Location Shoulder    Pain Orientation Left    Pain Descriptors / Indicators Aching;Sore;Tightness    Pain Type Chronic pain    Pain Radiating Towards NA    Pain Onset More than a month ago    Pain Frequency Intermittent    Aggravating Factors  WB through the L arm    Pain Relieving Factors Exercises    Effect of Pain on Daily Activities Unable to mountain bike     Multiple Pain Sites No                             [facility] Adult PT Treatment/Exercise - 12/17/20 0001      Exercises   Exercises Shoulder      Shoulder Exercises: Supine   Protraction Strengthening;Both;20 reps;Weights    Protraction Weight (lbs) 10#    Protraction Limitations Reach up and punch at top    Internal Rotation AROM;Left;10 reps;Other (comment)    Internal Rotation Limitations 10 seconds      Shoulder Exercises: Seated   Retraction Strengthening;Both;10 reps;Other (comment)    Retraction Limitations 5 seconds shoulder blade pinches      Shoulder Exercises: Sidelying   External Rotation Strengthening;Left;10 reps;Weights    External Rotation Weight (lbs) 2#    External Rotation Limitations 3 sets      Shoulder Exercises: Standing   External Rotation Strengthening;Left;10 reps;Theraband;Other (comment)    Theraband Level (Shoulder External Rotation) Level 2 (Red)    External Rotation Limitations 3 seconds 2 sets towel roll under elbow    Internal Rotation Strengthening;Left;10 reps;Theraband;Other (comment)    Internal Rotation Limitations 3 seconds towel roll under elbow  Manual Therapy   Manual Therapy Passive ROM    Passive ROM Passive overpressure with active stretching                  PT Education - 12/17/20 1856    Education Details Reviewed HEP with 1 progression due to Stephen Marks being out of town next week.    Person(s) Educated Patient    Methods Explanation;Demonstration;Tactile cues;Verbal cues    Comprehension Verbalized understanding;Tactile cues required;Need further instruction;Returned demonstration;Verbal cues required            PT Short Term Goals - 12/17/20 1856      PT SHORT TERM GOAL #1   Title Stephen Marks will improve L shoulder AROM for flexion to 170; ER to 90 and IR to 60 degrees.    Baseline 160, 85 and 45 respectively    Time 4    Period Weeks    Status On-going    Target Date 01/11/21              PT Long Term Goals - 12/17/20 1856      PT LONG TERM GOAL #1   Title Improve FOTO score to 73.    Baseline 59    Time 6    Period Weeks    Status On-going      PT LONG TERM GOAL #2   Title Improve L shoulder ER strength to 21 pounds and IR to 30 pounds.    Baseline 7 and 23 respectively    Time 6    Period Weeks    Status On-going      PT LONG TERM GOAL #3   Title Stephen Marks will be independent with his long-term HEP at [place].    Time 6    Period Weeks    Status On-going      PT LONG TERM GOAL #4   Status On-going      PT LONG TERM GOAL #5   Status On-going                 Plan - 12/17/20 1857    Clinical Impression Statement Stephen Marks notes early progress with his AROM.  Due to leaving town on vacation, focus today was on current HEP perfection as most impairments are already being addressed.  Follow-up in 1-2 weeks to assess progress.    Personal Factors and Comorbidities Comorbidity 1    Comorbidities Previous L shoulder surgery as a child, significant L shoulder weakness    Examination-Activity Limitations Sleep;Reach Overhead;Carry    Examination-Participation Restrictions Community Activity    Stability/Clinical Decision Making Stable/Uncomplicated    Rehab Potential Good    PT Frequency Other (comment)   1-2X/week   PT Duration 6 weeks    PT Treatment/Interventions ADLs/Self Care Home Management;Moist Heat;Iontophoresis 4mg /ml Dexamethasone;Electrical Stimulation;Cryotherapy;Therapeutic activities;Therapeutic exercise;Neuromuscular re-education;Patient/family education;Manual techniques;Vasopneumatic Device    PT Next Visit Plan Capsular stretching, scapular and RTC strengthening    PT Home Exercise Plan Access Code: [email or web address]    Consulted and Agree with Plan of Care Patient           Patient will benefit from skilled therapeutic intervention in order to improve the following deficits and impairments:  Decreased activity tolerance,Decreased endurance,Decreased  range of motion,Decreased strength,Increased edema,Impaired flexibility,Impaired UE functional use,Pain  Visit Diagnosis: Stiffness of left shoulder, not elsewhere classified  Chronic left shoulder pain  Muscle weakness (generalized)     Problem List Patient Active Problem List   Diagnosis Date Noted  . Pain in left shoulder  11/08/2020  . Diabetes mellitus type I ([place]) 08/14/2020  . Nuclear sclerotic cataract of both eyes 08/14/2020  . Low back pain 11/17/2019  . Asthma 06/06/2015  . Allergic rhinoconjunctivitis 06/06/2015    Farley Ly PT, MPT 12/17/2020, 6:59 PM  [facility] Physical Therapy [address] [place], [place], [address] Phone: [phone]   Fax:  [phone]  Name: Stephen Marks MRN: [id number] Date of Birth: Jun 03, 1966

## 2021-01-02 ENCOUNTER — Encounter: Payer: Commercial Managed Care - PPO | Admitting: Rehabilitative and Restorative Service Providers"

## 2021-01-09 ENCOUNTER — Ambulatory Visit (INDEPENDENT_AMBULATORY_CARE_PROVIDER_SITE_OTHER): Payer: Commercial Managed Care - PPO | Admitting: Rehabilitative and Restorative Service Providers"

## 2021-01-09 ENCOUNTER — Other Ambulatory Visit: Payer: Self-pay

## 2021-01-09 ENCOUNTER — Encounter: Payer: Self-pay | Admitting: Rehabilitative and Restorative Service Providers"

## 2021-01-09 DIAGNOSIS — M25512 Pain in left shoulder: Secondary | ICD-10-CM

## 2021-01-09 DIAGNOSIS — G8929 Other chronic pain: Secondary | ICD-10-CM

## 2021-01-09 DIAGNOSIS — M25612 Stiffness of left shoulder, not elsewhere classified: Secondary | ICD-10-CM

## 2021-01-09 DIAGNOSIS — M6281 Muscle weakness (generalized): Secondary | ICD-10-CM

## 2021-01-09 NOTE — Therapy (Addendum)
[facility] Physical Therapy [address] [place], [place], [address] Phone: [phone]   Fax:  [phone]  Physical Therapy Treatment/Reassessment  Patient Details  Name: Stephen Marks MRN: [id number] Date of Birth: 1966-02-09 Referring Provider (PT): Garald Balding MD  PHYSICAL THERAPY DISCHARGE SUMMARY  Visits from [facility]: 3  Current functional level related to goals / functional outcomes: See note   Remaining deficits: See note   Education / Equipment: HEP   Patient agrees to discharge. Patient goals were partially met. Patient is being discharged due to not returning since the last visit.  Encounter Date: 01/09/2021   PT End of Session - 01/09/21 1650     Visit Number 3    Number of Visits 12    Date for PT Re-Evaluation 01/11/21    PT Start Time 1600    PT Stop Time 5945    PT Time Calculation (min) 39 min    Activity Tolerance Patient tolerated treatment well;No increased pain;Patient limited by fatigue    Behavior During Therapy [facility] for tasks assessed/performed             Past Medical History:  Diagnosis Date   Diabetes mellitus without complication ([place])     Past Surgical History:  Procedure Laterality Date   ABDOMINAL SURGERY     EYE SURGERY      There were no vitals filed for this visit.   Subjective Assessment - 01/09/21 1646     Subjective Stephen Marks reports inconsistency with his HEP while on vacation.  AROM, strength and function have improved although he still has limitations.    Pertinent History Diabetic (Type 1), previous L shoulder surgery as a child to treat an infection    Limitations Other (comment)   Limits participation in mountain biking and other leisure pursuits.  Unable to lean on L UE and sometimes bothers him at night.   Diagnostic tests X-Ray    Patient Stated Goals Get back to normal activities without restriction.    Currently in Pain? Yes    Pain Score 3     Pain Location Shoulder     Pain Orientation Left    Pain Descriptors / Indicators Aching;Tightness;Sore    Pain Type Chronic pain    Pain Radiating Towards NA    Pain Onset More than a month ago    Pain Frequency Intermittent    Aggravating Factors  WB through the L shoulder    Pain Relieving Factors Exercises    Effect of Pain on Daily Activities Unable to mountain bike    Multiple Pain Sites No                [facility] PT Assessment - 01/09/21 0001       ROM / Strength   AROM / PROM / Strength AROM;Strength      AROM   AROM Assessment Site Shoulder    Right/Left Shoulder Left    Left Shoulder Flexion 180 Degrees    Left Shoulder Internal Rotation 60 Degrees    Left Shoulder External Rotation 90 Degrees    Left Shoulder Horizontal ADduction 45 Degrees      Strength   Overall Strength Deficits    Strength Assessment Site Shoulder    Right/Left Shoulder Left    Left Shoulder Internal Rotation --   42.1 pounds   Left Shoulder External Rotation --   14.2 pounds  [place] Adult PT Treatment/Exercise - 01/09/21 0001       Exercises   Exercises Shoulder      Shoulder Exercises: Supine   Protraction Strengthening;Both;20 reps;Weights    Protraction Weight (lbs) 10#    Protraction Limitations Reach up and punch at top    Internal Rotation AROM;Left;10 reps;Other (comment)    Internal Rotation Limitations 10 seconds      Shoulder Exercises: Seated   Retraction Strengthening;Both;10 reps;Other (comment)    Retraction Limitations 5 seconds shoulder blade pinches      Shoulder Exercises: Sidelying   External Rotation Strengthening;Left;10 reps;Weights    External Rotation Weight (lbs) 2#    External Rotation Limitations 3 sets      Shoulder Exercises: Standing   External Rotation Strengthening;Left;10 reps;Theraband;Other (comment)    Theraband Level (Shoulder External Rotation) Level 2 (Red)    External Rotation Limitations 3 seconds 2 sets towel roll  under elbow    Internal Rotation Strengthening;Left;10 reps;Theraband;Other (comment)    Theraband Level (Shoulder Internal Rotation) Level 2 (Red)    Internal Rotation Limitations 3 seconds towel roll under elbow      Manual Therapy   Manual Therapy Passive ROM    Passive ROM Passive overpressure with active stretching                    PT Education - 01/09/21 1648     Education Details Reviewed HEP and RA findings.    Person(s) Educated Patient    Methods Explanation;Demonstration;Tactile cues;Verbal cues    Comprehension Verbalized understanding;Tactile cues required;Returned demonstration;Need further instruction;Verbal cues required              PT Short Term Goals - 01/09/21 1649       PT SHORT TERM GOAL #1   Title Stephen Marks will improve L shoulder AROM for flexion to 170; ER to 90 and IR to 60 degrees.    Baseline 160, 85 and 45 respectively    Time 4    Period Weeks    Status Achieved    Target Date 01/11/21               PT Long Term Goals - 01/09/21 1649       PT LONG TERM GOAL #1   Title Improve FOTO score to 73.    Baseline 71 (was 59)    Time 6    Period Weeks    Status On-going      PT LONG TERM GOAL #2   Title Improve L shoulder ER strength to 21 pounds and IR to 30 pounds.    Baseline 14 and 42 pounds (were 7 and 23) respectively    Time 6    Period Weeks    Status Partially Met      PT LONG TERM GOAL #3   Title Stephen Marks will be independent with his long-term HEP at [place].    Time 6    Period Weeks    Status On-going      PT LONG TERM GOAL #4   Status On-going      PT LONG TERM GOAL #5   Status On-going                   Plan - 01/09/21 1651     Clinical Impression Statement Stephen Marks notes early progress with his L shoulder, even with less than the recommended HEP compliance while on vacation.  Progress is noted in pain, self-reported function and objective strength for  L shoulder ER (7 to 14 pounds).  Strength is  still well shy of the expected 28+ pounds and Stephen Marks may have a rotator cuff tear.  We discussed non-surgical approaches to rotator cuff tear (and made it clear he may not have a tear) and recommended he follow-up with Dr. Durward Fortes to see what he recommends.  We will continue supervised PT and consistent HEP compliance until he can get in to see Dr. Durward Fortes.    Personal Factors and Comorbidities Comorbidity 1    Comorbidities Previous L shoulder surgery as a child, significant L shoulder weakness    Examination-Activity Limitations Sleep;Reach Overhead;Carry    Examination-Participation Restrictions Community Activity    Stability/Clinical Decision Making Stable/Uncomplicated    Rehab Potential Good    PT Frequency Other (comment)   1-2X/week   PT Duration 6 weeks    PT Treatment/Interventions ADLs/Self Care Home Management;Moist Heat;Iontophoresis 4mg /ml Dexamethasone;Electrical Stimulation;Cryotherapy;Therapeutic activities;Therapeutic exercise;Neuromuscular re-education;Patient/family education;Manual techniques;Vasopneumatic Device    PT Next Visit Plan Capsular stretching, scapular and RTC strengthening    PT Home Exercise Plan Access Code: [email or web address]    Consulted and Agree with Plan of Care Patient             Patient will benefit from skilled therapeutic intervention in order to improve the following deficits and impairments:  Decreased activity tolerance,Decreased endurance,Decreased range of motion,Decreased strength,Increased edema,Impaired flexibility,Impaired UE functional use,Pain  Visit Diagnosis: Stiffness of left shoulder, not elsewhere classified  Chronic left shoulder pain  Muscle weakness (generalized)     Problem List Patient Active Problem List   Diagnosis Date Noted   Pain in left shoulder 11/08/2020   Diabetes mellitus type I ([place]) 08/14/2020   Nuclear sclerotic cataract of both eyes 08/14/2020   Low back pain 11/17/2019   Asthma 06/06/2015   Allergic  rhinoconjunctivitis 06/06/2015    Farley Ly PT, MPT 01/09/2021, 4:55 PM  [facility] Physical Therapy [address] [place], [place], [address] Phone: [phone]   Fax:  [phone]  Name: Stephen Marks MRN: [id number] Date of Birth: 29-Dec-1965

## 2021-01-16 ENCOUNTER — Encounter: Payer: Commercial Managed Care - PPO | Admitting: Rehabilitative and Restorative Service Providers"

## 2021-01-17 ENCOUNTER — Ambulatory Visit (INDEPENDENT_AMBULATORY_CARE_PROVIDER_SITE_OTHER): Payer: Commercial Managed Care - PPO | Admitting: Orthopaedic Surgery

## 2021-01-17 ENCOUNTER — Other Ambulatory Visit: Payer: Self-pay

## 2021-01-17 ENCOUNTER — Encounter: Payer: Self-pay | Admitting: Orthopaedic Surgery

## 2021-01-17 VITALS — Ht 71.0 in | Wt 170.0 lb

## 2021-01-17 DIAGNOSIS — M25512 Pain in left shoulder: Secondary | ICD-10-CM

## 2021-01-17 DIAGNOSIS — G8929 Other chronic pain: Secondary | ICD-10-CM | POA: Diagnosis not present

## 2021-01-17 NOTE — Progress Notes (Signed)
Office Visit Note   Patient: Stephen Marks           Date of Birth: July 18, 1966           MRN: [id number] Visit Date: 01/17/2021              Requested by: Haywood Pao, MD [address] [place],  [place] [address] PCP: Haywood Pao, MD   Assessment & Plan: Visit Diagnoses:  1. Chronic left shoulder pain     Plan: [name] is been followed for the problem referable to his left shoulder.  He was evaluated in February with positive impingement.  Films were negative.  We tried a course of physical therapy and he notes that it did make a difference but he still having trouble with overhead activity and sleeping on that side.  The physical therapist was concerned that he might have a "tear".  He does have impingement but excellent range of motion and good strength.  I will order an MRI arthrogram.  Long discussion regarding possible diagnosis and treatment options.  We will know more after the scan  Follow-Up Instructions: Return After MRI arthrogram left shoulder.   Orders:  Orders Placed This Encounter  Procedures  . MR Shoulder Left w/ contrast  . Arthrogram   No orders of the defined types were placed in this encounter.     Procedures: No procedures performed   Clinical Data: No additional findings.   Subjective: Chief Complaint  Patient presents with  . Left Shoulder - Follow-up  Patient presents today for follow up on his left shoulder. He was here in February and has been doing physical therapy for his shoulder. He said that the exercises do help, but his therapist thinks he may have a tear. He said that today his shoulder feels good, but that is not always the case. He has difficulty sleeping when it hurts after activity. He does not take anything for pain. He has noticed that this arm continues to be weaker than the right.   HPI  Review of Systems   Objective: Vital Signs: Ht 5\' 11"  (1.803 m)   Wt 170 lb (77.1 kg)   BMI 23.71 kg/m   Physical  Exam Constitutional:      Appearance: He is well-developed.  Eyes:     Pupils: Pupils are equal, round, and reactive to light.  Pulmonary:     Effort: Pulmonary effort is normal.  Skin:    General: Skin is warm and dry.  Neurological:     Mental Status: He is alert and oriented to person, place, and time.  Psychiatric:        Behavior: Behavior normal.     Ortho Exam left shoulder with tenderness in the anterior subacromial region.  No pain at the [facility] joint.  Does have mild impingement testing on the extreme of external rotation.  Seems to have good strength.  Biceps intact.  Skin intact.  Minimally positive empty can test Specialty Comments:  No specialty comments available.  Imaging: No results found.   PMFS History: Patient Active Problem List   Diagnosis Date Noted  . Pain in left shoulder 11/08/2020  . Diabetes mellitus type I ([place]) 08/14/2020  . Nuclear sclerotic cataract of both eyes 08/14/2020  . Low back pain 11/17/2019  . Asthma 06/06/2015  . Allergic rhinoconjunctivitis 06/06/2015   Past Medical History:  Diagnosis Date  . Diabetes mellitus without complication ([place])     Family History  Problem Relation  Age of Onset  . Prostate cancer Father     Past Surgical History:  Procedure Laterality Date  . ABDOMINAL SURGERY    . EYE SURGERY     Social History   Occupational History  . Not on file  Tobacco Use  . Smoking status: Never Smoker  . Smokeless tobacco: Never Used  Vaping Use  . Vaping Use: Never used  Substance and Sexual Activity  . Alcohol use: Yes    Comment: 1-2 WEEK  . Drug use: Not Currently  . Sexual activity: Not on file

## 2021-02-05 ENCOUNTER — Ambulatory Visit
Admission: RE | Admit: 2021-02-05 | Discharge: 2021-02-05 | Disposition: A | Discharge status: Home / Self Care | Payer: Commercial Managed Care - PPO | Source: Ambulatory Visit | Attending: Orthopaedic Surgery | Admitting: Orthopaedic Surgery

## 2021-02-05 DIAGNOSIS — G8929 Other chronic pain: Secondary | ICD-10-CM

## 2021-02-05 DIAGNOSIS — M25512 Pain in left shoulder: Secondary | ICD-10-CM

## 2021-02-05 MED ORDER — IOPAMIDOL (ISOVUE-M 200) INJECTION 41%
12.0000 mL | Freq: Once | INTRAMUSCULAR | Status: AC
  Administered 2021-02-05: 12 mL via INTRA_ARTICULAR

## 2021-02-20 ENCOUNTER — Encounter: Payer: Self-pay | Admitting: Orthopaedic Surgery

## 2021-02-20 ENCOUNTER — Other Ambulatory Visit: Payer: Self-pay

## 2021-02-20 ENCOUNTER — Ambulatory Visit (INDEPENDENT_AMBULATORY_CARE_PROVIDER_SITE_OTHER): Payer: Commercial Managed Care - PPO | Admitting: Orthopaedic Surgery

## 2021-02-20 VITALS — Ht 71.0 in | Wt 170.0 lb

## 2021-02-20 DIAGNOSIS — G8929 Other chronic pain: Secondary | ICD-10-CM

## 2021-02-20 DIAGNOSIS — M25512 Pain in left shoulder: Secondary | ICD-10-CM | POA: Diagnosis not present

## 2021-02-20 NOTE — Progress Notes (Signed)
Office Visit Note   Patient: Stephen Marks           Date of Birth: 17-Nov-1965           MRN: [id number] Visit Date: 02/20/2021              Requested by: Haywood Pao, MD [address] [place],   [address] PCP: Haywood Pao, MD   Assessment & Plan: Visit Diagnoses:  1. Chronic left shoulder pain     Plan: Stephen Marks had an MRI scan of his left shoulder demonstrating a partial-thickness bursal sided fraying of the anterior aspect of the distal supraspinatus.  The infraspinatus, subscapularis and teres minor were normal.  No full-thickness rotator cuff tear.  There was no atrophy or muscle edema.  Long head biceps was intact and no significant arthropathy of the AC joint.  No cartilage defects in the glenohumeral joint.  There was a [place] complex in the labrum but no evidence of a tear.  Stephen Marks is doing quite well and having minimal symptoms.  He has been through a course of physical therapy and has a home exercise program.  Plan to see him back at any time in the future but he is doing quite well we discussed the MRI scan he is aware of the pathology  Follow-Up Instructions: Return if symptoms worsen or fail to improve.   Orders:  No orders of the defined types were placed in this encounter.  No orders of the defined types were placed in this encounter.     Procedures: No procedures performed   Clinical Data: No additional findings.   Subjective: Chief Complaint  Patient presents with  . Left Shoulder - Follow-up, Pain    MRI review  Patient returns to review MRI Left Shoulder Arthrogram.  He states that there has not been any changes since his last visit. He notices that the pain is not quite as bad, however, he has not been as active in the last few weeks. He is not having to take anything for pain at the moment.  HPI  Review of Systems   Objective: Vital Signs: Ht 5\' 11"  (1.803 m)   Wt 170 lb (77.1 kg)   BMI 23.71 kg/m   Physical  Exam Constitutional:      Appearance: He is well-developed.  Eyes:     Pupils: Pupils are equal, round, and reactive to light.  Pulmonary:     Effort: Pulmonary effort is normal.  Skin:    General: Skin is warm and dry.  Neurological:     Mental Status: He is alert and oriented to person, place, and time.  Psychiatric:        Behavior: Behavior normal.     Ortho Exam awake alert and oriented x3.  Comfortable sitting full overhead motion of his left shoulder with good strength.  Negative Speed sign negative empty can testing.  Minimal discomfort on the extreme of external rotation with in the impingement position.  No grating or crepitation.  Specialty Comments:  No specialty comments available.  Imaging: No results found.   PMFS History: Patient Active Problem List   Diagnosis Date Noted  . Pain in left shoulder 11/08/2020  . Diabetes mellitus type I ([place]) 08/14/2020  . Nuclear sclerotic cataract of both eyes 08/14/2020  . Low back pain 11/17/2019  . Asthma 06/06/2015  . Allergic rhinoconjunctivitis 06/06/2015   Past Medical History:  Diagnosis Date  . Diabetes mellitus without complication ([place])  Family History  Problem Relation Age of Onset  . Prostate cancer Father     Past Surgical History:  Procedure Laterality Date  . ABDOMINAL SURGERY    . EYE SURGERY     Social History   Occupational History  . Not on file  Tobacco Use  . Smoking status: Never Smoker  . Smokeless tobacco: Never Used  Vaping Use  . Vaping Use: Never used  Substance and Sexual Activity  . Alcohol use: Yes    Comment: 1-2 WEEK  . Drug use: Not Currently  . Sexual activity: Not on file

## 2021-08-15 ENCOUNTER — Encounter (INDEPENDENT_AMBULATORY_CARE_PROVIDER_SITE_OTHER): Payer: Commercial Managed Care - PPO | Admitting: Ophthalmology

## 2021-08-22 ENCOUNTER — Ambulatory Visit (INDEPENDENT_AMBULATORY_CARE_PROVIDER_SITE_OTHER): Payer: Commercial Managed Care - PPO | Admitting: Ophthalmology

## 2021-08-22 ENCOUNTER — Other Ambulatory Visit: Payer: Self-pay

## 2021-08-22 DIAGNOSIS — E109 Type 1 diabetes mellitus without complications: Secondary | ICD-10-CM | POA: Diagnosis not present

## 2021-08-22 NOTE — Progress Notes (Signed)
08/22/2021     CHIEF COMPLAINT Patient presents for  Chief Complaint  Patient presents with   Diabetes      HISTORY OF PRESENT ILLNESS: Stephen Marks is a 55 y.o. male who presents to the clinic today for:   HPI   Long-term history of diabetes type 1, with no incident diabetic retinopathy in the past, now 1 year follow-up dilate examination Last edited by Hurman Horn, MD on 08/22/2021  8:14 AM.      Referring physician: Haywood Pao, MD [place],  [place] [id number]  HISTORICAL INFORMATION:   Selected notes from the [place]: No current outpatient medications on file. (Ophthalmic Drugs)   No current facility-administered medications for this visit. (Ophthalmic Drugs)   Current Outpatient Medications (Other)  Medication Sig   atorvastatin (LIPITOR) 10 MG tablet Take 10 mg by mouth daily.   cetirizine (ZYRTEC) 10 MG tablet Take 10 mg by mouth daily.   escitalopram (LEXAPRO) 20 MG tablet    Flaxseed, Linseed, (FLAXSEED OIL PO) Take by mouth.   fluticasone (FLONASE) 50 MCG/ACT nasal spray Place into both nostrils daily.   insulin lispro (HUMALOG) 100 UNIT/ML injection Inject into the skin 3 (three) times daily before meals.   meloxicam (MOBIC) 7.5 MG tablet Take 7.5 mg by mouth daily.   montelukast (SINGULAIR) 10 MG tablet Can take one tablet by mouth once daily as directed.   Multiple Vitamin (MULTIVITAMIN) tablet Take 1 tablet by mouth daily.   ramipril (ALTACE) 10 MG capsule Take 10 mg by mouth daily.   No current facility-administered medications for this visit. (Other)      REVIEW OF SYSTEMS: ROS   Negative for: Constitutional, Gastrointestinal, Neurological, Skin, Genitourinary, Musculoskeletal, HENT, Endocrine, Cardiovascular, Eyes, Respiratory, Psychiatric, Allergic/Imm, Heme/Lymph Last edited by Hurman Horn, MD on 08/22/2021  8:14 AM.       ALLERGIES Allergies  Allergen Reactions    Penicillins Hives   Sulfa Antibiotics     PAST MEDICAL HISTORY Past Medical History:  Diagnosis Date   Diabetes mellitus without complication ([place])    Past Surgical History:  Procedure Laterality Date   ABDOMINAL SURGERY     EYE SURGERY      FAMILY HISTORY Family History  Problem Relation Age of Onset   Prostate cancer Father     SOCIAL HISTORY Social History   Tobacco Use   Smoking status: Never   Smokeless tobacco: Never  Vaping Use   Vaping Use: Never used  Substance Use Topics   Alcohol use: Yes    Comment: 1-2 WEEK   Drug use: Not Currently         OPHTHALMIC EXAM:  Base Eye Exam     Visual Acuity (ETDRS)       Right Left   Dist cc 20/15 -3 20/15 -2   Dist ph cc NI NI    Correction: Glasses         Tonometry (Tonopen, 8:10 AM)       Right Left   Pressure 16 17         Pupils       Pupils APD   Right PERRL None   Left PERRL None         Neuro/Psych     Oriented x3: Yes   Mood/Affect: Normal         Dilation     Both eyes: 1.0% Mydriacyl, 2.5% Phenylephrine @  8:31 AM           Slit Lamp and Fundus Exam     External Exam       Right Left   External Normal Normal         Slit Lamp Exam       Right Left   Lids/Lashes Normal Normal   Conjunctiva/Sclera White and quiet White and quiet   Cornea Clear Clear   Anterior Chamber Deep and quiet Deep and quiet   Iris Round and reactive Round and reactive   Lens Trace Nuclear sclerosis Trace Nuclear sclerosis   Anterior Vitreous Normal Normal         Fundus Exam       Right Left   Posterior Vitreous Normal Normal   Disc Normal Normal   C/D Ratio 0.25 0.25   Macula Normal Normal   Vessels no DR no DR   Periphery Normal Normal            IMAGING AND PROCEDURES  Imaging and Procedures for 08/22/21  Color Fundus Photography Optos - OU - Both Eyes       Right Eye Disc findings include normal observations. Macula : normal observations. Vessels :  normal observations. Periphery : normal observations.   Left Eye Progression has been stable. Disc findings include normal observations. Macula : normal observations. Vessels : normal observations. Periphery : normal observations.              ASSESSMENT/PLAN:  No problem-specific Assessment & Plan notes found for this encounter.      ICD-10-CM   1. Type 1 diabetes mellitus without complication (HCC)  Q68.3 Color Fundus Photography Optos - OU - Both Eyes      Fu once a year  2.  3.  Ophthalmic Meds Ordered this visit:  No orders of the defined types were placed in this encounter.      Return in about 1 year (around 08/22/2022) for COLOR FP, OCT, DILATE OU.  There are no Patient Instructions on file for this visit.   Explained the diagnoses, plan, and follow up with the patient and they expressed understanding.  Patient expressed understanding of the importance of proper follow up care.   Clent Demark Edrees Valent M.D. Diseases & Surgery of the Retina and Vitreous [facility]ina [facility] Diabetic [place] 08/22/21     Abbreviations: M myopia (nearsighted); A astigmatism; H hyperopia (farsighted); P presbyopia; Mrx spectacle prescription;  CTL contact lenses; OD right eye; OS left eye; OU both eyes  XT exotropia; ET esotropia; PEK punctate epithelial keratitis; PEE punctate epithelial erosions; DES dry eye syndrome; MGD meibomian gland dysfunction; ATs artificial tears; PFAT's preservative free artificial tears; [place] nuclear sclerotic cataract; PSC posterior subcapsular cataract; ERM epi-retinal membrane; PVD posterior vitreous detachment; RD retinal detachment; DM diabetes mellitus; DR diabetic retinopathy; NPDR non-proliferative diabetic retinopathy; PDR proliferative diabetic retinopathy; CSME clinically significant macular edema; DME diabetic macular edema; dbh dot blot hemorrhages; CWS cotton wool spot; POAG primary open angle glaucoma; C/D cup-to-disc ratio; HVF humphrey visual field; GVF  goldmann visual field; OCT optical coherence tomography; IOP intraocular pressure; BRVO Branch retinal vein occlusion; CRVO central retinal vein occlusion; CRAO central retinal artery occlusion; BRAO branch retinal artery occlusion; RT retinal tear; SB scleral buckle; PPV pars plana vitrectomy; VH Vitreous hemorrhage; PRP panretinal laser photocoagulation; IVK intravitreal kenalog; VMT vitreomacular traction; MH Macular hole;  NVD neovascularization of the disc; NVE neovascularization elsewhere; AREDS age related eye disease study; ARMD age related macular degeneration;  POAG primary open angle glaucoma; EBMD epithelial/anterior basement membrane dystrophy; ACIOL anterior chamber intraocular lens; IOL intraocular lens; PCIOL posterior chamber intraocular lens; Phaco/IOL phacoemulsification with intraocular lens placement; [place] photorefractive keratectomy; LASIK laser assisted in situ keratomileusis; HTN hypertension; DM diabetes mellitus; COPD chronic obstructive pulmonary disease

## 2022-07-11 ENCOUNTER — Other Ambulatory Visit (HOSPITAL_COMMUNITY): Payer: Self-pay | Admitting: Cardiology

## 2022-07-11 DIAGNOSIS — E7800 Pure hypercholesterolemia, unspecified: Secondary | ICD-10-CM

## 2022-07-15 ENCOUNTER — Ambulatory Visit (HOSPITAL_BASED_OUTPATIENT_CLINIC_OR_DEPARTMENT_OTHER)
Admission: RE | Admit: 2022-07-15 | Discharge: 2022-07-15 | Disposition: A | Discharge status: Home / Self Care | Payer: Commercial Managed Care - PPO | Source: Ambulatory Visit | Attending: Cardiology | Admitting: Cardiology

## 2022-07-15 ENCOUNTER — Encounter (HOSPITAL_BASED_OUTPATIENT_CLINIC_OR_DEPARTMENT_OTHER): Payer: Self-pay

## 2022-07-15 DIAGNOSIS — E7800 Pure hypercholesterolemia, unspecified: Secondary | ICD-10-CM | POA: Insufficient documentation

## 2022-08-25 ENCOUNTER — Encounter (INDEPENDENT_AMBULATORY_CARE_PROVIDER_SITE_OTHER): Payer: Commercial Managed Care - PPO | Admitting: Ophthalmology

## 2022-08-27 ENCOUNTER — Ambulatory Visit: Payer: Commercial Managed Care - PPO | Admitting: Adult Health

## 2022-09-24 ENCOUNTER — Encounter: Payer: Self-pay | Admitting: Adult Health

## 2022-09-24 ENCOUNTER — Ambulatory Visit (INDEPENDENT_AMBULATORY_CARE_PROVIDER_SITE_OTHER): Payer: Commercial Managed Care - PPO | Admitting: Adult Health

## 2022-09-24 DIAGNOSIS — F331 Major depressive disorder, recurrent, moderate: Secondary | ICD-10-CM | POA: Diagnosis not present

## 2022-09-24 DIAGNOSIS — F411 Generalized anxiety disorder: Secondary | ICD-10-CM | POA: Diagnosis not present

## 2022-09-24 MED ORDER — DULOXETINE HCL 30 MG PO CPEP: 30.0000 mg | ORAL_CAPSULE | Freq: Every day | ORAL | 2 refills | Status: DC

## 2022-09-24 NOTE — Progress Notes (Signed)
[facility] MD/PA/NP Initial Note  09/24/2022 5:13 PM Stephen Marks  MRN:  [id number]  Chief Complaint:   HPI:   Patient seen today for initial psychiatric evaluation.   Describes mood today as "ok". Pe[place]. Denies tearfulness..Mood symptoms - reports depression, anxiety, and irritability. Reports some worry, rumination, and over thinking. Mood is lower. Stating "I feel ike I'm under a wet blanket". Has been working with PCP to help manage mood symptoms. Recently tapered down from Lexapro '20mg'$  daily to '10mg'$  daily. Was also tried on Wellbutrin XL '150mg'$  daily, but did not tolerate it - felt like it was too activating. Is willing to consider other options. Seeing therapist regularly - [facility]. Stable interest and motivation. Taking medications as prescribed. Energy levels noticeably lower. Active, has a regular exercise routine.  Enjoys some usual interests and activities. Married. Lives with wife and son - 15 years. Spending time with family. Appetite adequate. Weight stable - 175 pounds. Sleeps well most nights. Averages 7+ hours. Focus and concentration stable. Completing tasks. Managing aspects of household. 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  Denies AH or VH. Denies self harm. Denies substance use.    Previous medication trials:  Wellbutrin   Visit Diagnosis:    ICD-10-CM   1. Major depressive disorder, recurrent episode, moderate (HCC)  F33.1     2. Generalized anxiety disorder  F41.1 DULoxetine (CYMBALTA) 30 MG capsule      Past Psychiatric History: Denies psychiatric hospitalization.   Past Medical History:  Past Medical History:  Diagnosis Date   Diabetes mellitus without complication ([place])     Past Surgical History:  Procedure Laterality Date   ABDOMINAL SURGERY     EYE SURGERY      Family Psychiatric History: Denies any family history of mental illness.   Family History:  Family History  Problem Relation Age of Onset   Prostate cancer Father      Social History:  Social History   Socioeconomic History   Marital status: Married    Spouse name: Not on file   Number of children: Not on file   Years of education: Not on file   Highest education level: Not on file  Occupational History   Not on file  Tobacco Use   Smoking status: Never   Smokeless tobacco: Never  Vaping Use   Vaping Use: Never used  Substance and Sexual Activity   Alcohol use: Yes    Comment: 1-2 WEEK   Drug use: Not Currently   Sexual activity: Not on file  Other Topics Concern   Not on file  Social History Narrative   Not on file   Social Determinants of Health   Financial Resource Strain: Not on file  Food Insecurity: Not on file  Transportation Needs: Not on file  Physical Activity: Not on file  Stress: Not on file  Social Connections: Not on file    Allergies:  Allergies  Allergen Reactions   Cefuroxime Hives   Penicillins Hives   Sulfa Antibiotics     Metabolic Disorder Labs: No results found for: "HGBA1C", "MPG" No results found for: "PROLACTIN" No results found for: "CHOL", "TRIG", "HDL", "CHOLHDL", "VLDL", "LDLCALC" No results found for: "TSH"  Therapeutic Level Labs: No results found for: "LITHIUM" No results found for: "VALPROATE" No results found for: "CBMZ"  Current Medications: Current Outpatient Medications  Medication Sig Dispense Refill   DULoxetine (CYMBALTA) 30 MG capsule Take 1 capsule (30 mg total) by mouth daily. 30 capsule  2   atorvastatin (LIPITOR) 10 MG tablet Take 10 mg by mouth daily.     cetirizine (ZYRTEC) 10 MG tablet Take 10 mg by mouth daily.     escitalopram (LEXAPRO) 10 MG tablet Take 10 mg by mouth daily.     Flaxseed, Linseed, (FLAXSEED OIL PO) Take by mouth.     fluticasone (FLONASE) 50 MCG/ACT nasal spray Place into both nostrils daily.     insulin lispro (HUMALOG) 100 UNIT/ML injection Inject into the skin 3 (three) times daily before meals.     meloxicam (MOBIC) 7.5 MG tablet Take 7.5  mg by mouth daily.     montelukast (SINGULAIR) 10 MG tablet Can take one tablet by mouth once daily as directed. 30 tablet 11   Multiple Vitamin (MULTIVITAMIN) tablet Take 1 tablet by mouth daily.     ramipril (ALTACE) 10 MG capsule Take 10 mg by mouth daily.     No current facility-administered medications for this visit.    Medication Side Effects: none  Orders placed this visit:  No orders of the defined types were placed in this encounter.   Psychiatric Specialty Exam:  Review of Systems  There were no vitals taken for this visit.There is no height or weight on file to calculate BMI.  General Appearance: Casual and Neat  Eye Contact:  Good  Speech:  Clear and Coherent and Normal Rate  Volume:  Normal  Mood:  Anxious and Depressed  Affect:  Appropriate and Congruent  Thought Process:  Coherent and Descriptions of Associations: Intact  Orientation:  Full (Time, Place, and Person)  Thought Content: Logical   Suicidal Thoughts:  No  Homicidal Thoughts:  No  Memory:  WNL  Judgement:  Good  Insight:  Good  Psychomotor Activity:  Normal  Concentration:  Concentration: Good and Attention Span: Good  Recall:  Good  Fund of Knowledge: Good  Language: Good  Assets:  Communication Skills Desire for Improvement Financial Resources/Insurance Housing Intimacy Leisure Time Physical Health Resilience Social Support Talents/Skills Transportation Vocational/Educational  ADL's:  Intact  Cognition: WNL  Prognosis:  Good   Screenings: MDQ  Receiving Psychotherapy: Yes Curator  Treatment Plan/Recommendations:   Continue:  Lexapro '10mg'$  daily Add Cymbalta '30mg'$  daily  Consider d/c of Lexapro if Cymbalta helpful  Continue therapy - Cathy Showfety  RTC 4 weeks  Greater than 50% of face to face time with patient was spent on counseling and coordination of care.   Patient advised to contact office with any questions, adverse effects, or acute worsening in signs  and symptoms.      Aloha Gell, NP

## 2022-10-29 ENCOUNTER — Ambulatory Visit (INDEPENDENT_AMBULATORY_CARE_PROVIDER_SITE_OTHER): Payer: Commercial Managed Care - PPO | Admitting: Adult Health

## 2022-10-29 ENCOUNTER — Encounter: Payer: Self-pay | Admitting: Adult Health

## 2022-10-29 DIAGNOSIS — F331 Major depressive disorder, recurrent, moderate: Secondary | ICD-10-CM | POA: Diagnosis not present

## 2022-10-29 DIAGNOSIS — F411 Generalized anxiety disorder: Secondary | ICD-10-CM

## 2022-10-29 MED ORDER — DULOXETINE HCL 30 MG PO CPEP: 30.0000 mg | ORAL_CAPSULE | Freq: Every day | ORAL | 2 refills | Status: DC

## 2022-10-29 NOTE — Progress Notes (Signed)
Stephen Marks [id number] Dec 31, 1965 57 y.o.  Subjective:   Patient ID:  Stephen Marks is a 57 y.o. (DOB 05-30-1966) male.  Chief Complaint: No chief complaint on file.   HPI Stephen Marks presents to the office today for follow-up of MDD and GAD.  Describes mood today as "ok". [place]. Denies tearfulness..Mood symptoms - reports decreased depression, anxiety, and irritability. Reports some worry, rumination, and over thinking - "more on the weekends". Mood is "better - less on my shoulders". Stating "I've seen a noticeable improvement". Feels like the addition of Cymbalta has been helpful. Seeing therapist regularly - [facility]. Stable interest and motivation. Taking medications as prescribed. Energy levels improved. Active, has a regular exercise routine.  Enjoys some usual interests and activities. Married. Lives with wife and son - 65 years. Spending time with family. Appetite adequate. Weight stable - 175 pounds. Sleeps well most nights. Averages 7+ hours. Focus and concentration stable. Completing tasks. Managing aspects of household. 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  Denies AH or VH. Denies self harm. Denies substance use.    Previous medication trials:  Wellbutrin   Review of Systems:  Review of Systems  Musculoskeletal:  Negative for gait problem.  Neurological:  Negative for tremors.  Psychiatric/Behavioral:         Please refer to HPI    Medications: I have reviewed the patient's current medications.  Current Outpatient Medications  Medication Sig Dispense Refill   atorvastatin (LIPITOR) 10 MG tablet Take 10 mg by mouth daily.     cetirizine (ZYRTEC) 10 MG tablet Take 10 mg by mouth daily.     DULoxetine (CYMBALTA) 30 MG capsule Take 1 capsule (30 mg total) by mouth daily. 30 capsule 2   escitalopram (LEXAPRO) 10 MG tablet Take 10 mg by mouth daily.     Flaxseed, Linseed, (FLAXSEED OIL PO) Take by mouth.     fluticasone (FLONASE) 50 MCG/ACT  nasal spray Place into both nostrils daily.     insulin lispro (HUMALOG) 100 UNIT/ML injection Inject into the skin 3 (three) times daily before meals.     meloxicam (MOBIC) 7.5 MG tablet Take 7.5 mg by mouth daily.     montelukast (SINGULAIR) 10 MG tablet Can take one tablet by mouth once daily as directed. 30 tablet 11   Multiple Vitamin (MULTIVITAMIN) tablet Take 1 tablet by mouth daily.     ramipril (ALTACE) 10 MG capsule Take 10 mg by mouth daily.     No current facility-administered medications for this visit.    Medication Side Effects: None  Allergies:  Allergies  Allergen Reactions   Cefuroxime Hives   Penicillins Hives   Sulfa Antibiotics     Past Medical History:  Diagnosis Date   Diabetes mellitus without complication ([place])     Past Medical History, Surgical history, Social history, and Family history were reviewed and updated as appropriate.   Please see review of systems for further details on the patient's review from today.   Objective:   Physical Exam:  There were no vitals taken for this visit.  Physical Exam Constitutional:      General: He is not in acute distress. Musculoskeletal:        General: No deformity.  Neurological:     Mental Status: He is alert and oriented to person, place, and time.     Coordination: Coordination normal.  Psychiatric:        Attention and Perception: Attention and perception normal.  He does not perceive auditory or visual hallucinations.        Mood and Affect: Mood normal. Mood is not anxious or depressed. Affect is not labile, blunt, angry or inappropriate.        Speech: Speech normal.        Behavior: Behavior normal.        Thought Content: Thought content normal. Thought content is not paranoid or delusional. Thought content does not include homicidal or suicidal ideation. Thought content does not include homicidal or suicidal plan.        Cognition and Memory: Cognition and memory normal.        Judgment:  Judgment normal.     Comments: Insight intact     Lab Review:     Component Value Date/Time   NA 135 10/31/2009 1510   K 4.5 10/31/2009 1510   CL 102 10/31/2009 1510   CO2 25 10/31/2009 1510   GLUCOSE 181 (H) 10/31/2009 1510   BUN 15 10/31/2009 1510   CREATININE 0.92 10/31/2009 1510   CALCIUM 9.0 10/31/2009 1510   PROT 7.5 10/31/2009 1510   ALBUMIN 4.2 10/31/2009 1510   AST 40 (H) 10/31/2009 1510   ALT 46 10/31/2009 1510   ALKPHOS 69 10/31/2009 1510   BILITOT 0.5 10/31/2009 1510   GFRNONAA >60 10/31/2009 1510   GFRAA  10/31/2009 1510    >60        The eGFR has been calculated using the MDRD equation. This calculation has not been validated in all clinical situations. eGFR's persistently <60 mL/min signify possible Chronic Kidney Disease.       Component Value Date/Time   WBC 8.6 10/31/2009 1510   RBC 4.93 10/31/2009 1510   HGB 14.5 10/31/2009 1510   HCT 42.3 10/31/2009 1510   PLT 155 10/31/2009 1510   MCV 85.8 10/31/2009 1510   MCHC 34.3 10/31/2009 1510   RDW 12.6 10/31/2009 1510   LYMPHSABS 0.3 (L) 10/31/2009 1510   MONOABS 0.1 10/31/2009 1510   EOSABS 0.0 10/31/2009 1510   BASOSABS 0.0 10/31/2009 1510    No results found for: "POCLITH", "LITHIUM"   No results found for: "PHENYTOIN", "PHENOBARB", "VALPROATE", "CBMZ"   .res Assessment: Plan:    Continue:  Lexapro 56m daily Cymbalta 319mdaily  Consider d/c of Lexapro if Cymbalta increased at next visit  Continue NAC and Mg  Continue therapy - Cathy Showfety  RTC 6 weeks  Time spent with patient was 15 minutes. Greater than 50% of face to face time with patient was spent on counseling and coordination of care.    Patient advised to contact office with any questions, adverse effects, or acute worsening in signs and symptoms.  Diagnoses and all orders for this visit:  Major depressive disorder, recurrent episode, moderate (HCC)  Generalized anxiety disorder    Please see After Visit  Summary for patient specific instructions.  No future appointments.  No orders of the defined types were placed in this encounter.   -------------------------------

## 2022-12-10 ENCOUNTER — Ambulatory Visit (INDEPENDENT_AMBULATORY_CARE_PROVIDER_SITE_OTHER): Payer: Self-pay | Admitting: Adult Health

## 2022-12-10 DIAGNOSIS — F489 Nonpsychotic mental disorder, unspecified: Secondary | ICD-10-CM

## 2022-12-10 NOTE — Progress Notes (Signed)
Patient no show appointment. ? ?

## 2022-12-19 IMAGING — MR MR SHOULDER*L* W/ CM
6 series · 40 of 40 positions shown · IV contrast (agent unspecified)
Comparison: X-ray 11/08/2020

CLINICAL DATA: Left shoulder pain and weakness for 6 months. No
known injury.

EXAM:
MR ARTHROGRAM OF THE LEFT SHOULDER
TECHNIQUE: Multiplanar, multisequence MR imaging of the left shoulder was
performed following the administration of intra-articular contrast.
CONTRAST:  See Injection Documentation.

[Series 3: T1 fat-sat · axial · 4.0mm · 0.29mm/px · z∈[-38,+67]mm · 6 of 22 slices shown (1 of 4)]
[im 1/22]
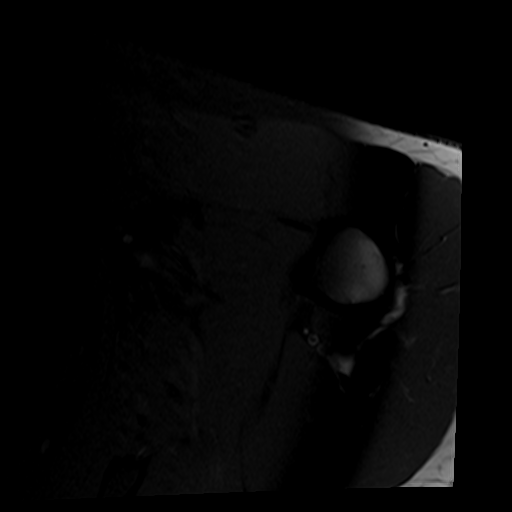
[im 5/22]
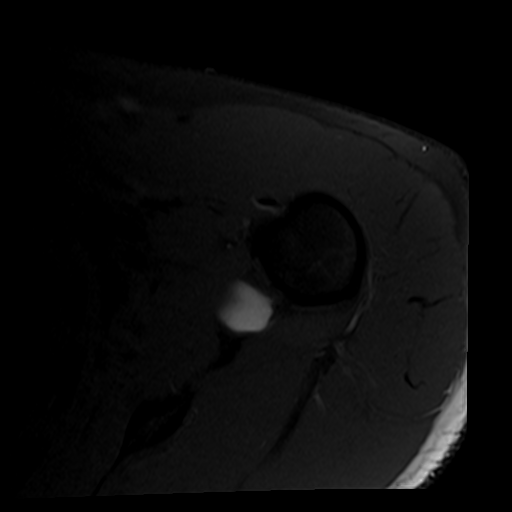
[im 9/22]
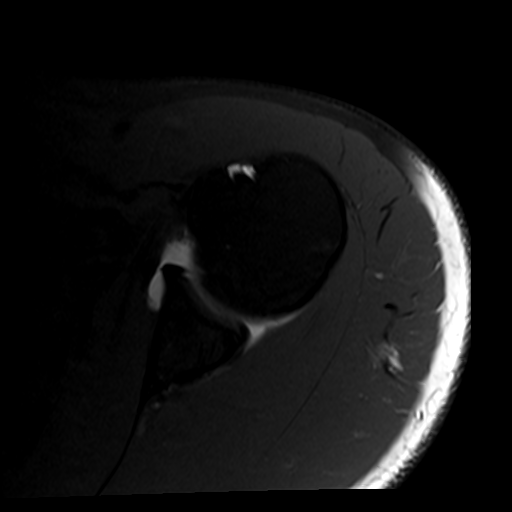
[im 13/22]
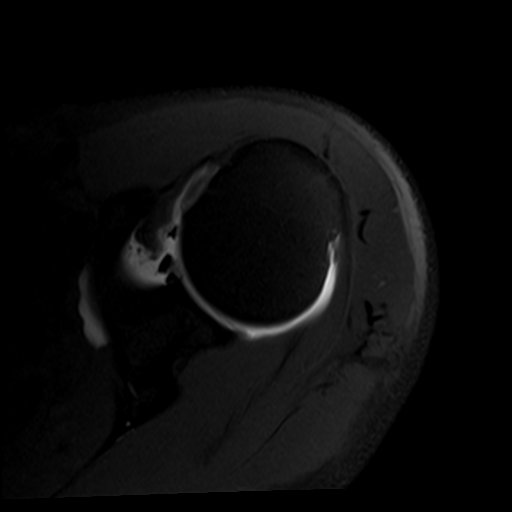
[im 17/22]
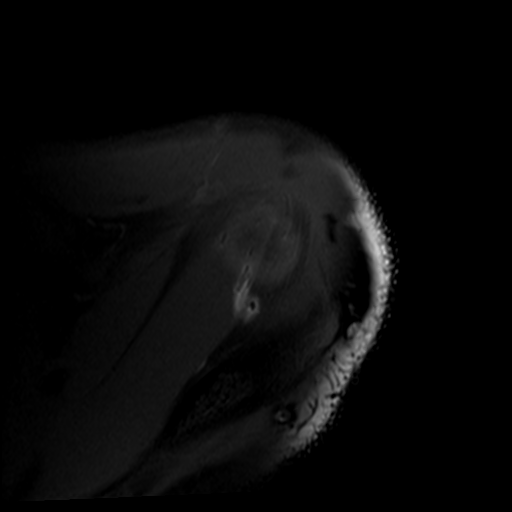
[im 22/22]
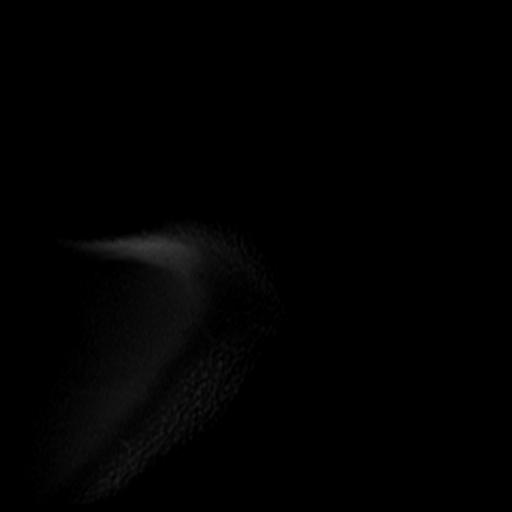

[Series 4: T2 fat-sat · oblique · 4.0mm · 0.62mm/px · 7 of 24 slices shown (1 of 2)]
[im 1/24]
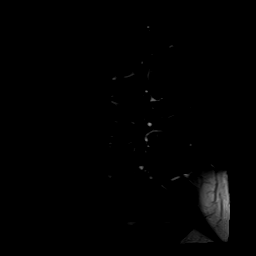
[im 4/24]
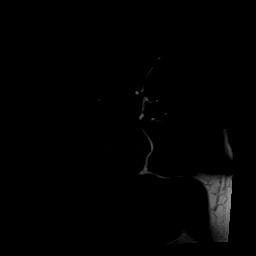
[im 8/24]
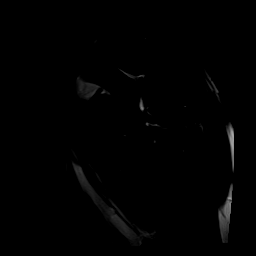
[im 12/24]
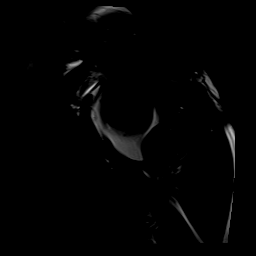
[im 16/24]
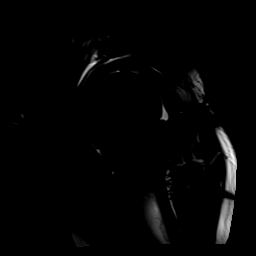
[im 20/24]
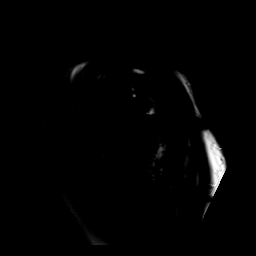
[im 24/24]
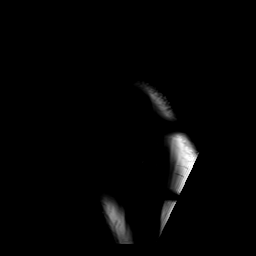

[Series 5: T1 fat-sat · oblique · 4.0mm · 0.59mm/px · 7 of 22 slices shown (2 of 4)]
[im 1/22]
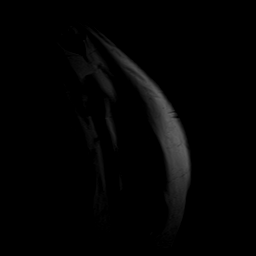
[im 4/22]
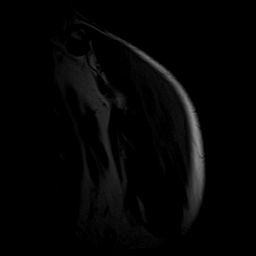
[im 8/22]
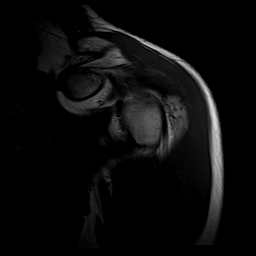
[im 11/22]
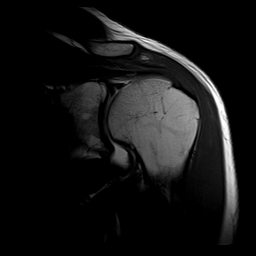
[im 15/22]
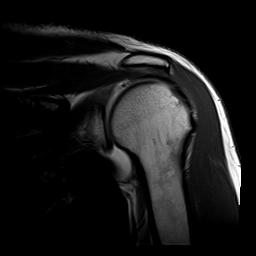
[im 18/22]
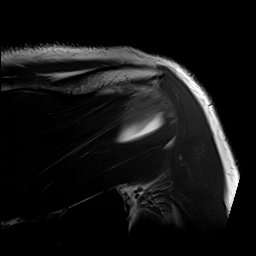
[im 22/22]
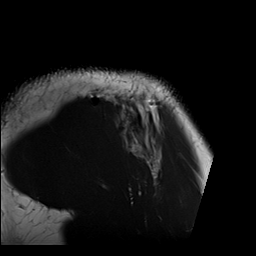

[Series 6: T1 fat-sat · oblique · 4.0mm · 0.59mm/px · 7 of 22 slices shown (3 of 4)]
[im 1/22]
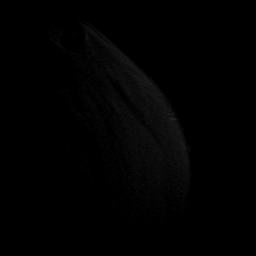
[im 4/22]
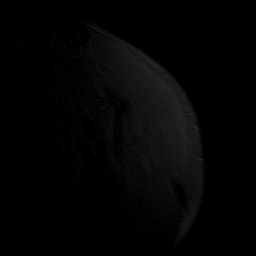
[im 8/22]
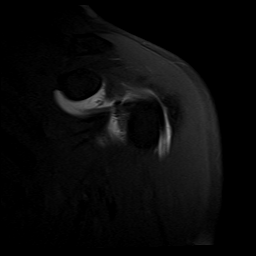
[im 11/22]
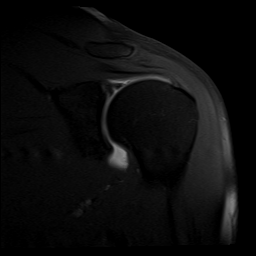
[im 15/22]
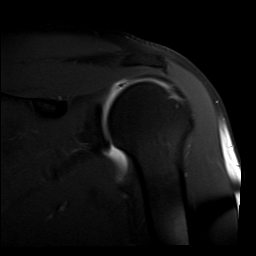
[im 18/22]
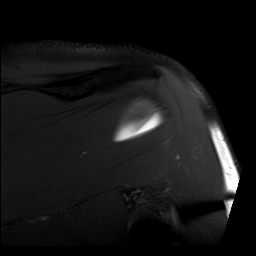
[im 22/22]
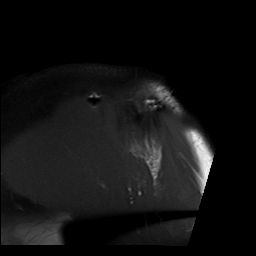

[Series 7: T2 fat-sat · oblique · 4.0mm · 0.59mm/px · 7 of 22 slices shown (2 of 2)]
[im 1/22]
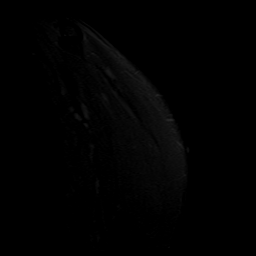
[im 4/22]
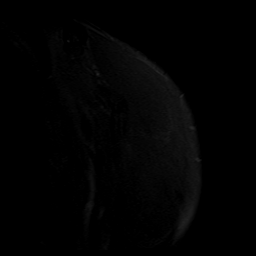
[im 8/22]
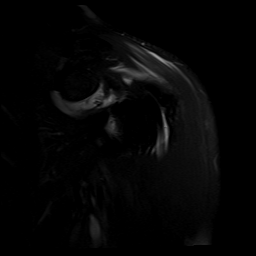
[im 11/22]
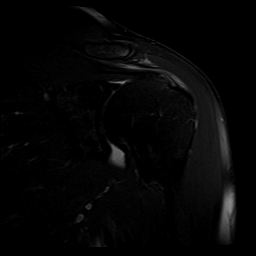
[im 15/22]
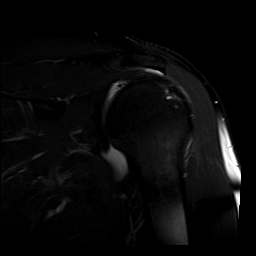
[im 18/22]
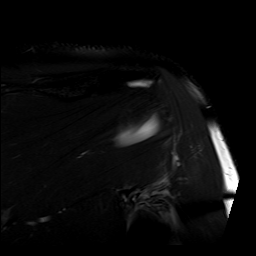
[im 22/22]
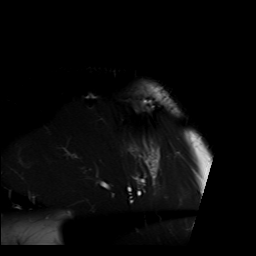

[Series 10: T1 fat-sat · sagittal · 4.0mm · 0.59mm/px · 6 of 21 slices shown (4 of 4)]
[im 1/21]
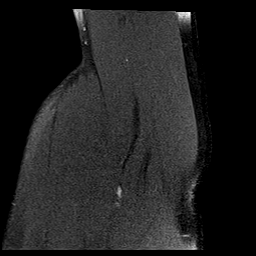
[im 5/21]
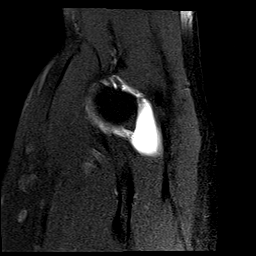
[im 9/21]
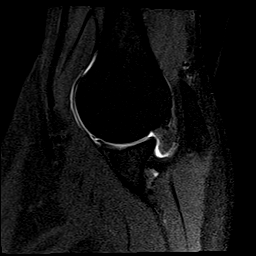
[im 13/21]
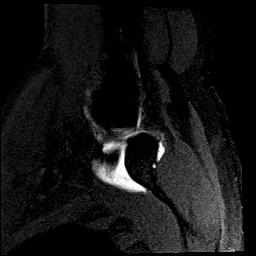
[im 17/21]
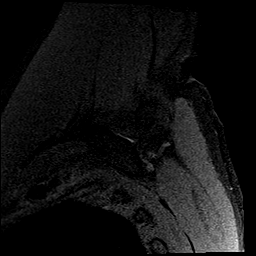
[im 21/21]
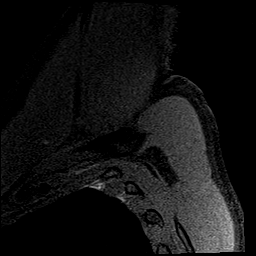

[40 of 40 positions shown; findings below may reference images not displayed]

FINDINGS: Rotator cuff: Partial thickness bursal sided fraying of the anterior
aspect of the distal supraspinatus tendon (series 7, images 12-13).
Infraspinatus, subscapularis, and teres minor tendons within normal
limits. No full-thickness rotator cuff tear.

Muscles: Preserved bulk and signal intensity of the rotator cuff
musculature without edema, atrophy, or fatty infiltration.

Biceps long head: Intact.

Acromioclavicular Joint: No significant arthropathy of the AC joint.
Small volume subacromial-subdeltoid bursal fluid. No contrast is
present within the subacromial-subdeltoid bursal space.

Glenohumeral Joint: Adequately distended with injected contrast. No
cartilage defect.

Labrum: Smooth undercutting of the superior and posterosuperior
labrum, favored to represent a superior sublabral sulcus. No
contrast is identified extending into the substance of the labrum.
Absence of the anterosuperior labrum with possible Tonda complex
variant anatomy. No definite labral tear. No paralabral cyst.

Bones: No acute fracture. No dislocation. No bone marrow edema. No
marrow replacing bone lesion.

Other: None.
IMPRESSION: 1. Partial thickness bursal sided fraying of the anterior aspect of
the distal supraspinatus tendon. No full-thickness rotator cuff
tear.
2. Mild subacromial-subdeltoid bursitis.
3. Smooth undercutting of the superior and posterosuperior labrum,
favored to represent a superior sublabral sulcus. No definite labral
tear.

## 2022-12-25 ENCOUNTER — Ambulatory Visit (INDEPENDENT_AMBULATORY_CARE_PROVIDER_SITE_OTHER): Payer: Commercial Managed Care - PPO | Admitting: Adult Health

## 2022-12-25 ENCOUNTER — Encounter: Payer: Self-pay | Admitting: Adult Health

## 2022-12-25 DIAGNOSIS — F331 Major depressive disorder, recurrent, moderate: Secondary | ICD-10-CM | POA: Diagnosis not present

## 2022-12-25 DIAGNOSIS — F411 Generalized anxiety disorder: Secondary | ICD-10-CM

## 2022-12-25 MED ORDER — ESCITALOPRAM OXALATE 10 MG PO TABS: 10.0000 mg | ORAL_TABLET | Freq: Every day | ORAL | 1 refills | Status: DC

## 2022-12-25 MED ORDER — DULOXETINE HCL 30 MG PO CPEP: 30.0000 mg | ORAL_CAPSULE | Freq: Every day | ORAL | 1 refills | Status: DC

## 2022-12-25 NOTE — Progress Notes (Signed)
Stephen Marks [id number] 12-21-1965 57 y.o.  Subjective:   Patient ID:  Stephen Marks is a 89 y.o. (DOB 24-Nov-1965) male.  Chief Complaint: No chief complaint on file.   HPI Stephen Marks presents to the office today for follow-up of MDD and GAD.  Describes mood today as "ok". Peasant. Denies tearfulness. Mood symptoms - reports decreased depression, anxiety, and irritability. Reports decreased worry, rumination, and over thinking. Mood is consistent. Stating "my symptoms are noticeably less". Feels like the Lexapro and addition of Cymbalta has been helpful. Seeing therapist regularly - [facility]. Stable interest and motivation. Taking medications as prescribed. Energy levels improved. Active, has a regular exercise routine - 3 or more times a week. Walking daily.  Enjoys some usual interests and activities. Married. Lives with wife and son - 14 years. Spending time with family. Appetite adequate. Weight stable - 175 pounds.  Sleeps well most nights. Averages 7+ hours. Focus and concentration stable. Completing tasks. Managing aspects of household. 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  Denies AH or VH. Denies self harm. Denies substance use.    Previous medication trials:  Wellbutrin  Review of Systems:  Review of Systems  Musculoskeletal:  Negative for gait problem.  Neurological:  Negative for tremors.  Psychiatric/Behavioral:         Please refer to HPI    Medications: I have reviewed the patient's current medications.  Current Outpatient Medications  Medication Sig Dispense Refill   atorvastatin (LIPITOR) 10 MG tablet Take 10 mg by mouth daily.     cetirizine (ZYRTEC) 10 MG tablet Take 10 mg by mouth daily.     DULoxetine (CYMBALTA) 30 MG capsule Take 1 capsule (30 mg total) by mouth daily. 30 capsule 2   escitalopram (LEXAPRO) 10 MG tablet Take 10 mg by mouth daily.     Flaxseed, Linseed, (FLAXSEED OIL PO) Take by mouth.     fluticasone (FLONASE) 50  MCG/ACT nasal spray Place into both nostrils daily.     insulin lispro (HUMALOG) 100 UNIT/ML injection Inject into the skin 3 (three) times daily before meals.     meloxicam (MOBIC) 7.5 MG tablet Take 7.5 mg by mouth daily.     montelukast (SINGULAIR) 10 MG tablet Can take one tablet by mouth once daily as directed. 30 tablet 11   Multiple Vitamin (MULTIVITAMIN) tablet Take 1 tablet by mouth daily.     ramipril (ALTACE) 10 MG capsule Take 10 mg by mouth daily.     No current facility-administered medications for this visit.    Medication Side Effects: None  Allergies:  Allergies  Allergen Reactions   Cefuroxime Hives   Penicillins Hives   Sulfa Antibiotics     Past Medical History:  Diagnosis Date   Diabetes mellitus without complication (HCC)     Past Medical History, Surgical history, Social history, and Family history were reviewed and updated as appropriate.   Please see review of systems for further details on the patient's review from today.   Objective:   Physical Exam:  There were no vitals taken for this visit.  Physical Exam Constitutional:      General: He is not in acute distress. Musculoskeletal:        General: No deformity.  Neurological:     Mental Status: He is alert and oriented to person, place, and time.     Coordination: Coordination normal.  Psychiatric:        Attention and Perception: Attention and  perception normal. He does not perceive auditory or visual hallucinations.        Mood and Affect: Mood normal. Mood is not anxious or depressed. Affect is not labile, blunt, angry or inappropriate.        Speech: Speech normal.        Behavior: Behavior normal.        Thought Content: Thought content normal. Thought content is not paranoid or delusional. Thought content does not include homicidal or suicidal ideation. Thought content does not include homicidal or suicidal plan.        Cognition and Memory: Cognition and memory normal.         Judgment: Judgment normal.     Comments: Insight intact     Lab Review:     Component Value Date/Time   NA 135 10/31/2009 1510   K 4.5 10/31/2009 1510   CL 102 10/31/2009 1510   CO2 25 10/31/2009 1510   GLUCOSE 181 (H) 10/31/2009 1510   BUN 15 10/31/2009 1510   CREATININE 0.92 10/31/2009 1510   CALCIUM 9.0 10/31/2009 1510   PROT 7.5 10/31/2009 1510   ALBUMIN 4.2 10/31/2009 1510   AST 40 (H) 10/31/2009 1510   ALT 46 10/31/2009 1510   ALKPHOS 69 10/31/2009 1510   BILITOT 0.5 10/31/2009 1510   GFRNONAA >60 10/31/2009 1510   GFRAA  10/31/2009 1510    >60        The eGFR has been calculated using the MDRD equation. This calculation has not been validated in all clinical situations. eGFR's persistently <60 mL/min signify possible Chronic Kidney Disease.       Component Value Date/Time   WBC 8.6 10/31/2009 1510   RBC 4.93 10/31/2009 1510   HGB 14.5 10/31/2009 1510   HCT 42.3 10/31/2009 1510   PLT 155 10/31/2009 1510   MCV 85.8 10/31/2009 1510   MCHC 34.3 10/31/2009 1510   RDW 12.6 10/31/2009 1510   LYMPHSABS 0.3 (L) 10/31/2009 1510   MONOABS 0.1 10/31/2009 1510   EOSABS 0.0 10/31/2009 1510   BASOSABS 0.0 10/31/2009 1510    No results found for: "POCLITH", "LITHIUM"   No results found for: "PHENYTOIN", "PHENOBARB", "VALPROATE", "CBMZ"   .res Assessment: Plan:    Continue:  Lexapro 10mg  daily Cymbalta 30mg  daily  Continue NAC and Mg  Continue therapy - Cathy Showfety  RTC 6 months  Time spent with patient was 15 minutes. Greater than 50% of face to face time with patient was spent on counseling and coordination of care.    Patient advised to contact office with any questions, adverse effects, or acute worsening in signs and symptoms. There are no diagnoses linked to this encounter.   Please see After Visit Summary for patient specific instructions.  Future Appointments  Date Time Provider Department Center  12/25/2022  4:20 PM Lourdes Kucharski, Thereasa Solo, NP [facility] None    No orders of the defined types were placed in this encounter.   -------------------------------

## 2023-02-16 ENCOUNTER — Encounter (HOSPITAL_BASED_OUTPATIENT_CLINIC_OR_DEPARTMENT_OTHER): Payer: Self-pay | Admitting: Emergency Medicine

## 2023-02-16 ENCOUNTER — Emergency Department (HOSPITAL_BASED_OUTPATIENT_CLINIC_OR_DEPARTMENT_OTHER)
Admission: EM | Admit: 2023-02-16 | Discharge: 2023-02-16 | Disposition: A | Discharge status: Home / Self Care | Payer: Commercial Managed Care - PPO | Attending: Emergency Medicine | Admitting: Emergency Medicine

## 2023-02-16 ENCOUNTER — Other Ambulatory Visit: Payer: Self-pay

## 2023-02-16 DIAGNOSIS — Z203 Contact with and (suspected) exposure to rabies: Secondary | ICD-10-CM

## 2023-02-16 DIAGNOSIS — Z2914 Encounter for prophylactic rabies immune globin: Secondary | ICD-10-CM | POA: Insufficient documentation

## 2023-02-16 DIAGNOSIS — Z23 Encounter for immunization: Secondary | ICD-10-CM | POA: Diagnosis not present

## 2023-02-16 MED ORDER — RABIES IMMUNE GLOBULIN 150 UNIT/ML IM INJ
20.0000 [IU]/kg | INJECTION | Freq: Once | INTRAMUSCULAR | Status: AC
  Administered 2023-02-16: 1500 [IU]
  Filled 2023-02-16: qty 10

## 2023-02-16 MED ORDER — RABIES VACCINE, PCEC IM SUSR
1.0000 mL | Freq: Once | INTRAMUSCULAR | Status: AC
  Administered 2023-02-16: 1 mL via INTRAMUSCULAR
  Filled 2023-02-16: qty 1

## 2023-02-16 NOTE — Discharge Instructions (Signed)
You were seen for your possible exposure to rabies in the emergency department.   At home, please follow-up as soon as possible with animal control to see if you need to receive all of the rabies shots.  If you do not hear from them come back to the emergency department in 3 days or go to another facility that can give you the remainder of the rabies vaccination (3 days, 7 days, and 14 days).  Thank you for visiting our Emergency Department. It was a pleasure taking care of you today.

## 2023-02-16 NOTE — ED Notes (Signed)
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 NAD.

## 2023-02-16 NOTE — ED Provider Notes (Signed)
[place] EMERGENCY DEPARTMENT AT [facility] Provider Note   CSN: [id number] Arrival date & time: 02/16/23  4098     History  Chief Complaint  Patient presents with   Possible Rabies Exposure    Stephen Marks is a 57 y.o. male.  57 year old male with a history of insulin-dependent diabetes who presents to the emergency department after possible rabies exposure.  Says that he had a very small cut on his finger after he bit off a skin tag on his left index finger.  Says that he went to the dog park with his dog and picked up the water bowl that had lots of saliva on it.  Says he has anxiety and has been worried about rabies since then.  [place] and is scheduled to get a rabies shot today but has never been vaccinated for before.  No symptoms currently.  Unsure of which dogs may have used that bowl.  Up-to-date on his tetanus.       Home Medications Prior to Admission medications   Medication Sig Start Date End Date Taking? Authorizing Provider  atorvastatin (LIPITOR) 10 MG tablet Take 10 mg by mouth daily.    [provider]  cetirizine (ZYRTEC) 10 MG tablet Take 10 mg by mouth daily.    [provider]  DULoxetine (CYMBALTA) 30 MG capsule Take 1 capsule (30 mg total) by mouth daily. 12/25/22   Mozingo, Thereasa Solo, NP  escitalopram (LEXAPRO) 10 MG tablet Take 1 tablet (10 mg total) by mouth daily. 12/25/22   Mozingo, Thereasa Solo, NP  Flaxseed, Linseed, (FLAXSEED OIL PO) Take by mouth.    [provider]  fluticasone (FLONASE) 50 MCG/ACT nasal spray Place into both nostrils daily.    [provider]  insulin lispro (HUMALOG) 100 UNIT/ML injection Inject into the skin 3 (three) times daily before meals.    [provider]  meloxicam (MOBIC) 7.5 MG tablet Take 7.5 mg by mouth daily. 02/18/21   [provider]  montelukast (SINGULAIR) 10 MG tablet Can take one tablet by mouth once daily as directed.  01/31/19   Kozlow, Alvira Philips, MD  Multiple Vitamin (MULTIVITAMIN) tablet Take 1 tablet by mouth daily.    [provider]  ramipril (ALTACE) 10 MG capsule Take 10 mg by mouth daily.    [provider]      Allergies    Cefuroxime, Penicillins, and Sulfa antibiotics    Review of Systems   Review of Systems  Physical Exam Updated Vital Signs BP (!) 162/79 (BP Location: Right Arm)   Pulse 72   Temp 97.8 F (36.6 C) (Oral)   Resp 18   Ht 5\' 11"  (1.803 m)   Wt 74.8 kg   SpO2 100%   BMI 23.01 kg/m  Physical Exam Vitals and nursing note reviewed.  Constitutional:      General: He is not in acute distress.    Appearance: He is well-developed.  HENT:     Head: Normocephalic and atraumatic.     Right Ear: External ear normal.     Left Ear: External ear normal.     Nose: Nose normal.  Eyes:     Extraocular Movements: Extraocular movements intact.     Conjunctiva/sclera: Conjunctivae normal.     Pupils: Pupils are equal, round, and reactive to light.  Cardiovascular:     Rate and Rhythm: Normal rate and regular rhythm.     Heart sounds: Normal heart sounds.  Pulmonary:     Effort: Pulmonary effort is normal. No respiratory distress.     Breath sounds: Normal breath sounds.  Abdominal:     Palpations: Abdomen is soft.  Musculoskeletal:     Cervical back: Normal range of motion and neck supple.     Right lower leg: No edema.     Left lower leg: No edema.  Skin:    General: Skin is warm and dry.  Neurological:     Mental Status: He is alert. Mental status is at baseline.  Psychiatric:        Mood and Affect: Mood normal.        Behavior: Behavior normal.   Left index finger  ED Results / Procedures / Treatments   Labs (all labs ordered are listed, but only abnormal results are displayed) Labs Reviewed - No data to display  EKG None  Radiology No results found.  Procedures Procedures    Medications Ordered in ED Medications  rabies immune  globulin (HYPERRAB/KEDRAB) injection 1,500 Units (has no administration in time range)  rabies vaccine (RABAVERT) injection 1 mL (has no administration in time range)    ED Course/ Medical Decision Making/ A&P                             Medical Decision Making Risk Prescription drug management.   Stephen Marks is a 57 y.o. male with comorbidities that complicate the patient evaluation including insulin-dependent diabetes presents emergency department possible rabies exposure to his left index finger  Initial Ddx:  Rabies, tetanus, cellulitis/skin infection  MDM:  The patient had very low risk exposure to possible rabies.  It is impossible to figure out which dogs actually usable and coordinating the mall so we will go ahead and call the local state department office to see if there have been any rabid dog outbreaks in the area recently.  No signs of cellulitis or skin infection.  Is up-to-date on his tetanus shot already.  Plan:  [facility] department  ED Summary/Re-evaluation:  Unfortunately was unable to reach the state department.  Did do shared decision-making with the patient with regards to the risk of receiving the immunoglobulin and vaccine.  Feel that the bite is very low risk and expect this to the patient but he stated that he still wished to be treated for possible rabies exposure.  Was given the immunoglobulin and vaccine.  Will have him follow-up with the state department of health.   This patient presents to the ED for concern of complaints listed in HPI, this involves an extensive number of treatment options, and is a complaint that carries with it a high risk of complications and morbidity. Disposition including potential need for admission considered.   Dispo: DC Home. Return precautions discussed including, but not limited to, those listed in the AVS. Allowed pt time to ask questions which were answered fully prior to dc.  Records reviewed Outpatient Clinic Notes I  personally reviewed and interpreted the pt's EKG: see above for interpretation  I have reviewed the patients home medications and made adjustments as needed        Final Clinical Impression(s) / ED Diagnoses Final diagnoses:  Need for post exposure prophylaxis for rabies    Rx / DC Orders ED Discharge Orders     None         Rondel Baton, MD 02/16/23 [phone]

## 2023-02-16 NOTE — ED Triage Notes (Signed)
Pt arrives to ED with c/o possible rabies exposure. He notes he picked up a dog bowl with dog salvia on it at a local park and noticed he had an open cut on his hand.

## 2023-02-19 ENCOUNTER — Other Ambulatory Visit: Payer: Self-pay

## 2023-02-19 ENCOUNTER — Encounter (HOSPITAL_BASED_OUTPATIENT_CLINIC_OR_DEPARTMENT_OTHER): Payer: Self-pay | Admitting: Emergency Medicine

## 2023-02-19 ENCOUNTER — Emergency Department (HOSPITAL_BASED_OUTPATIENT_CLINIC_OR_DEPARTMENT_OTHER)
Admission: EM | Admit: 2023-02-19 | Discharge: 2023-02-19 | Disposition: A | Discharge status: Home / Self Care | Payer: Commercial Managed Care - PPO | Attending: Emergency Medicine | Admitting: Emergency Medicine

## 2023-02-19 DIAGNOSIS — Z23 Encounter for immunization: Secondary | ICD-10-CM | POA: Insufficient documentation

## 2023-02-19 DIAGNOSIS — Z203 Contact with and (suspected) exposure to rabies: Secondary | ICD-10-CM | POA: Insufficient documentation

## 2023-02-19 DIAGNOSIS — Z794 Long term (current) use of insulin: Secondary | ICD-10-CM | POA: Diagnosis not present

## 2023-02-19 MED ORDER — RABIES VACCINE, PCEC IM SUSR
1.0000 mL | Freq: Once | INTRAMUSCULAR | Status: AC
  Administered 2023-02-19: 1 mL via INTRAMUSCULAR
  Filled 2023-02-19: qty 1

## 2023-02-19 NOTE — ED Triage Notes (Signed)
Pt arrives to ED for second rabies dose.

## 2023-02-19 NOTE — Discharge Instructions (Addendum)
Please follow up for vaccines at urgent care Return if you are having any worsening wound symptoms

## 2023-02-19 NOTE — ED Notes (Signed)
Pt discharged to home using teachback Method. Discharge instructions have been discussed with patient and/or family members. Pt verbally acknowledges understanding d/c instructions, has been given opportunity for questions to be answered, and endorses comprehension to checkout at registration before leaving.  

## 2023-02-19 NOTE — ED Notes (Signed)
ED Provider at bedside. 

## 2023-02-19 NOTE — ED Provider Notes (Signed)
[facility] EMERGENCY DEPARTMENT AT [facility] Provider Note   CSN: [id number] Arrival date & time: 02/19/23  4098     History  Chief Complaint  Patient presents with   Rabies Injection    Stephen Marks is a 57 y.o. male.  HPI 57 year old male presents today for rabies vaccine.  He was seen here on June 3 after a possible exposure at a dog park.  He has a small wound on his right finger.  He this was exposed to dog saliva.  He is not having any problems with the wound and states that is healing.  He has no other complaints.     Home Medications Prior to Admission medications   Medication Sig Start Date End Date Taking? Authorizing Provider  atorvastatin (LIPITOR) 10 MG tablet Take 10 mg by mouth daily.    [provider]  cetirizine (ZYRTEC) 10 MG tablet Take 10 mg by mouth daily.    [provider]  DULoxetine (CYMBALTA) 30 MG capsule Take 1 capsule (30 mg total) by mouth daily. 12/25/22   Mozingo, Thereasa Solo, NP  escitalopram (LEXAPRO) 10 MG tablet Take 1 tablet (10 mg total) by mouth daily. 12/25/22   Mozingo, Thereasa Solo, NP  Flaxseed, Linseed, (FLAXSEED OIL PO) Take by mouth.    [provider]  fluticasone (FLONASE) 50 MCG/ACT nasal spray Place into both nostrils daily.    [provider]  insulin lispro (HUMALOG) 100 UNIT/ML injection Inject into the skin 3 (three) times daily before meals.    [provider]  meloxicam (MOBIC) 7.5 MG tablet Take 7.5 mg by mouth daily. 02/18/21   [provider]  montelukast (SINGULAIR) 10 MG tablet Can take one tablet by mouth once daily as directed. 01/31/19   Kozlow, Alvira Philips, MD  Multiple Vitamin (MULTIVITAMIN) tablet Take 1 tablet by mouth daily.    [provider]  ramipril (ALTACE) 10 MG capsule Take 10 mg by mouth daily.    [provider]      Allergies    Cefuroxime, Penicillins, and Sulfa antibiotics    Review of Systems   Review of  Systems  Physical Exam Updated Vital Signs BP 136/80 (BP Location: Right Arm)   Pulse 60   Temp 98.3 F (36.8 C) (Oral)   Resp 15   SpO2 99%  Physical Exam Vitals and nursing note reviewed.  Constitutional:      Appearance: He is well-developed.  HENT:     Head: Normocephalic and atraumatic.     Right Ear: External ear normal.     Left Ear: External ear normal.     Nose: Nose normal.  Eyes:     Extraocular Movements: Extraocular movements intact.  Neck:     Trachea: No tracheal deviation.  Pulmonary:     Effort: Pulmonary effort is normal.  Musculoskeletal:        General: Normal range of motion.     Comments: Right finger with very small well-healing abraded area  Skin:    General: Skin is warm and dry.  Neurological:     Mental Status: He is alert and oriented to person, place, and time.  Psychiatric:        Mood and Affect: Mood normal.        Behavior: Behavior normal.     ED Results / Procedures / Treatments   Labs (all labs ordered are listed, but only abnormal results are displayed) Labs Reviewed - No data to display  EKG None  Radiology No results found.  Procedures Procedures    Medications Ordered in ED Medications  rabies vaccine (RABAVERT) injection 1 mL (has no administration in time range)    ED Course/ Medical Decision Making/ A&P                             Medical Decision Making Risk Prescription drug management.   Advised regarding obtaining further rabies vaccines, return precautions, need for follow-up and voices understanding        Final Clinical Impression(s) / ED Diagnoses Final diagnoses:  Rabies exposure    Rx / DC Orders ED Discharge Orders     None         Margarita Grizzle, MD 02/19/23 [phone]

## 2023-02-23 ENCOUNTER — Ambulatory Visit
Admission: RE | Admit: 2023-02-23 | Discharge: 2023-02-23 | Disposition: A | Discharge status: Home / Self Care | Payer: Commercial Managed Care - PPO | Source: Ambulatory Visit | Attending: Nurse Practitioner | Admitting: Nurse Practitioner

## 2023-02-23 ENCOUNTER — Other Ambulatory Visit: Payer: Self-pay

## 2023-02-23 ENCOUNTER — Telehealth: Payer: Self-pay | Admitting: Adult Health

## 2023-02-23 ENCOUNTER — Ambulatory Visit (HOSPITAL_COMMUNITY): Payer: Self-pay

## 2023-02-23 DIAGNOSIS — Z23 Encounter for immunization: Secondary | ICD-10-CM | POA: Diagnosis not present

## 2023-02-23 DIAGNOSIS — Z203 Contact with and (suspected) exposure to rabies: Secondary | ICD-10-CM | POA: Diagnosis not present

## 2023-02-23 MED ORDER — RABIES VACCINE, PCEC IM SUSR
1.0000 mL | Freq: Once | INTRAMUSCULAR | Status: AC
  Administered 2023-02-23: 1 mL via INTRAMUSCULAR

## 2023-02-23 NOTE — ED Triage Notes (Signed)
Pt presents for day 7 of rabies injection series.

## 2023-02-23 NOTE — Telephone Encounter (Signed)
Xanax not prescribed by Almira Coaster.

## 2023-02-23 NOTE — Telephone Encounter (Signed)
Pt Stephen Marks @ 9:37a.  He said Eudelia Bunch might have called Almira Coaster about his Xanax, but he wanted to let Almira Coaster know he did not need her to fill his script for Xanax.  He got it from his GP doc.  Next appt 10/11

## 2023-03-02 ENCOUNTER — Ambulatory Visit
Admission: RE | Admit: 2023-03-02 | Discharge: 2023-03-02 | Disposition: A | Discharge status: Home / Self Care | Payer: Commercial Managed Care - PPO | Source: Ambulatory Visit | Attending: Urgent Care | Admitting: Urgent Care

## 2023-03-02 DIAGNOSIS — Z23 Encounter for immunization: Secondary | ICD-10-CM

## 2023-03-02 MED ORDER — RABIES VACCINE, PCEC IM SUSR
1.0000 mL | Freq: Once | INTRAMUSCULAR | Status: AC
  Administered 2023-03-02: 1 mL via INTRAMUSCULAR

## 2023-03-02 NOTE — ED Triage Notes (Signed)
Pt presents for day 14 of rabies injection.   Denies complaints.

## 2023-06-24 ENCOUNTER — Other Ambulatory Visit: Payer: Self-pay | Admitting: Adult Health

## 2023-06-24 DIAGNOSIS — F331 Major depressive disorder, recurrent, moderate: Secondary | ICD-10-CM

## 2023-06-24 DIAGNOSIS — F411 Generalized anxiety disorder: Secondary | ICD-10-CM

## 2023-06-25 ENCOUNTER — Ambulatory Visit: Payer: Commercial Managed Care - PPO | Admitting: Adult Health

## 2023-07-30 ENCOUNTER — Ambulatory Visit: Payer: Commercial Managed Care - PPO | Admitting: Adult Health

## 2023-07-30 ENCOUNTER — Encounter: Payer: Self-pay | Admitting: Adult Health

## 2023-07-30 DIAGNOSIS — F411 Generalized anxiety disorder: Secondary | ICD-10-CM | POA: Diagnosis not present

## 2023-07-30 DIAGNOSIS — F331 Major depressive disorder, recurrent, moderate: Secondary | ICD-10-CM | POA: Diagnosis not present

## 2023-07-30 MED ORDER — DULOXETINE HCL 30 MG PO CPEP: 30.0000 mg | ORAL_CAPSULE | Freq: Every day | ORAL | 1 refills | Status: DC

## 2023-07-30 MED ORDER — ESCITALOPRAM OXALATE 10 MG PO TABS: 10.0000 mg | ORAL_TABLET | Freq: Every day | ORAL | 1 refills | Status: DC

## 2023-07-30 NOTE — Progress Notes (Signed)
Stephen Marks [id number] 1966-05-27 57 y.o.  Subjective:   Patient ID:  Stephen Marks is a 89 y.o. (DOB 1965-09-29) male.  Chief Complaint: No chief complaint on file.   HPI QUENTION SCURA presents to the office today for follow-up of MDD and GAD.  Describes mood today as "ok". Peasant. Denies tearfulness. Mood symptoms - denies depression, anxiety, and irritability. Denies panic attacks. Denies worry, rumination, and over thinking. Mood is consistent. Stating "I feel like I'm doing ok". Feels like the Lexapro and Cymbalta are helpful. Seeing therapist regularly - [facility]. Stable interest and motivation. Taking medications as prescribed. Energy levels improved. Active, has a regular exercise routine. Enjoys some usual interests and activities. Married. Lives with wife and son. Spending time with family. Appetite adequate. Weight stable - 175 pounds.  Sleeps well most nights. Averages 7+ hours. Focus and concentration stable. Completing tasks. Managing aspects of household. 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  Denies AH or VH. Denies self harm. Denies substance use.    Previous medication trials:  Wellbutrin    Flowsheet Row ED from 02/19/2023 in [facility] Emergency Department at [facility] ED from 02/16/2023 in [facility] Emergency Department at [facility]  C-SSRS RISK CATEGORY No Risk No Risk        Review of Systems:  Review of Systems  Musculoskeletal:  Negative for gait problem.  Neurological:  Negative for tremors.  Psychiatric/Behavioral:         Please refer to HPI    Medications: I have reviewed the patient's current medications.  Current Outpatient Medications  Medication Sig Dispense Refill   atorvastatin (LIPITOR) 10 MG tablet Take 10 mg by mouth daily.     cetirizine (ZYRTEC) 10 MG tablet Take 10 mg by mouth daily.     DULoxetine (CYMBALTA) 30 MG capsule TAKE 1 CAPSULE DAILY 90 capsule 0   escitalopram (LEXAPRO) 10 MG  tablet Take 1 tablet (10 mg total) by mouth daily. 90 tablet 1   Flaxseed, Linseed, (FLAXSEED OIL PO) Take by mouth.     fluticasone (FLONASE) 50 MCG/ACT nasal spray Place into both nostrils daily.     insulin lispro (HUMALOG) 100 UNIT/ML injection Inject into the skin 3 (three) times daily before meals.     meloxicam (MOBIC) 7.5 MG tablet Take 7.5 mg by mouth daily.     montelukast (SINGULAIR) 10 MG tablet Can take one tablet by mouth once daily as directed. 30 tablet 11   Multiple Vitamin (MULTIVITAMIN) tablet Take 1 tablet by mouth daily.     ramipril (ALTACE) 10 MG capsule Take 10 mg by mouth daily.     No current facility-administered medications for this visit.    Medication Side Effects: None  Allergies:  Allergies  Allergen Reactions   Cefuroxime Hives   Penicillins Hives   Sulfa Antibiotics     Past Medical History:  Diagnosis Date   Diabetes mellitus without complication (HCC)     Past Medical History, Surgical history, Social history, and Family history were reviewed and updated as appropriate.   Please see review of systems for further details on the patient's review from today.   Objective:   Physical Exam:  There were no vitals taken for this visit.  Physical Exam Constitutional:      General: He is not in acute distress. Musculoskeletal:        General: No deformity.  Neurological:     Mental Status: He is alert and oriented  to person, place, and time.     Coordination: Coordination normal.  Psychiatric:        Attention and Perception: Attention and perception normal. He does not perceive auditory or visual hallucinations.        Mood and Affect: Mood normal. Affect is not labile, blunt, angry or inappropriate.        Speech: Speech normal.        Behavior: Behavior normal.        Thought Content: Thought content normal. Thought content is not paranoid or delusional. Thought content does not include homicidal or suicidal ideation. Thought content does  not include homicidal or suicidal plan.        Cognition and Memory: Cognition and memory normal.        Judgment: Judgment normal.     Comments: Insight intact     Lab Review:     Component Value Date/Time   NA 135 10/31/2009 1510   K 4.5 10/31/2009 1510   CL 102 10/31/2009 1510   CO2 25 10/31/2009 1510   GLUCOSE 181 (H) 10/31/2009 1510   BUN 15 10/31/2009 1510   CREATININE 0.92 10/31/2009 1510   CALCIUM 9.0 10/31/2009 1510   PROT 7.5 10/31/2009 1510   ALBUMIN 4.2 10/31/2009 1510   AST 40 (H) 10/31/2009 1510   ALT 46 10/31/2009 1510   ALKPHOS 69 10/31/2009 1510   BILITOT 0.5 10/31/2009 1510   GFRNONAA >60 10/31/2009 1510   GFRAA  10/31/2009 1510    >60        The eGFR has been calculated using the MDRD equation. This calculation has not been validated in all clinical situations. eGFR's persistently <60 mL/min signify possible Chronic Kidney Disease.       Component Value Date/Time   WBC 8.6 10/31/2009 1510   RBC 4.93 10/31/2009 1510   HGB 14.5 10/31/2009 1510   HCT 42.3 10/31/2009 1510   PLT 155 10/31/2009 1510   MCV 85.8 10/31/2009 1510   MCHC 34.3 10/31/2009 1510   RDW 12.6 10/31/2009 1510   LYMPHSABS 0.3 (L) 10/31/2009 1510   MONOABS 0.1 10/31/2009 1510   EOSABS 0.0 10/31/2009 1510   BASOSABS 0.0 10/31/2009 1510    No results found for: "POCLITH", "LITHIUM"   No results found for: "PHENYTOIN", "PHENOBARB", "VALPROATE", "CBMZ"   .res Assessment: Plan:    Continue:  Lexapro 10mg  daily Cymbalta 30mg  daily  Continue NAC and Mg  Continue therapy - Cathy Showfety  RTC 6 months  Time spent with patient was 15 minutes. Greater than 50% of face to face time with patient was spent on counseling and coordination of care.    Patient advised to contact office with any questions, adverse effects, or acute worsening in signs and symptoms.  There are no diagnoses linked to this encounter.   Please see After Visit Summary for patient specific  instructions.  No future appointments.  No orders of the defined types were placed in this encounter.   -------------------------------

## 2023-10-13 NOTE — Progress Notes (Signed)
Cardiology Office Note:  .   Date:  10/16/2023  ID:  Stephen Marks, DOB 11/29/1965, MRN [id number] PCP: Gaspar Garbe, MD  [facility] Providers Cardiologist:  None { History of Present Illness: .   Stephen Marks is a 58 y.o. male with history of DM, HLD who presents for the evaluation of coronary calcium at the request of Tisovec, Adelfa Koh, MD.   History of Present Illness   He is a 58 year old male with type 1 diabetes and hyperlipidemia who presents with an elevated coronary calcium score and chest pain. He was referred by Dr. Neale Burly for further evaluation.   He presents with an elevated coronary calcium score of 35.2, placing him in the 69th percentile. He denies associated symptoms such as shortness of breath, nausea, vomiting, or diaphoresis.  He experiences occasional chest discomfort described as sharp and achy, which has been intermittent for years. Recently, he noted chest discomfort while skiing, attributing it to muscular strain from using ski poles. No shortness of breath, nausea, vomiting, or diaphoresis accompany the discomfort.  His diabetes is well-controlled with a recent A1c of 6.1. He is on atorvastatin 20 mg, increased from 10 mg following the elevated calcium score in October 2023. His most recent LDL cholesterol level is 88 mg/dL.  He is physically active, regularly attending the gym and engaging in activities like mountain biking. During intense physical activity, he typically does not experience chest pain.  He has no history of myocardial infarction or stroke. He has been on ramipril for kidney protection, although he does not have a history of hypertension. He reports occasional elevated blood pressure readings in the past, but these have improved over the last decade. Father has arrhythmia.          Problem List Type 1 DM -A1c 6.1 HLD -T chol 142, HDL 44, LDL 88, TG 51 CAC 88.4 (69th percentile)    ROS: All other ROS reviewed and  negative. Pertinent positives noted in the HPI.     Studies Reviewed: Marland Kitchen   EKG Interpretation Date/Time:  Friday October 16 2023 08:18:36 EST Ventricular Rate:  54 PR Interval:  162 QRS Duration:  84 QT Interval:  424 QTC Calculation: 402 R Axis:   75  Text Interpretation: Sinus bradycardia No previous ECGs available Confirmed by Lennie Odor [id number]) on 10/16/2023 8:21:44 AM    CT CAC 07/15/2022 IMPRESSION: Coronary calcium score of 35.2. This was 32 percentile for age-, race-, and sex-matched controls. Physical Exam:   VS:  BP 128/68 (BP Location: Left Arm, Patient Position: Sitting, Cuff Size: Normal)   Pulse (!) 54   Ht 5\' 11"  (1.803 m)   Wt 174 lb (78.9 kg)   BMI 24.27 kg/m    Wt Readings from Last 3 Encounters:  10/16/23 174 lb (78.9 kg)  02/16/23 165 lb (74.8 kg)  02/20/21 170 lb (77.1 kg)    GEN: Well nourished, well developed in no acute distress NECK: No JVD; No carotid bruits CARDIAC: RRR, no murmurs, rubs, gallops RESPIRATORY:  Clear to auscultation without rales, wheezing or rhonchi  ABDOMEN: Soft, non-tender, non-distended EXTREMITIES:  No edema; No deformity  ASSESSMENT AND PLAN: .   Assessment and Plan    Chest Pain, possibly cardiac  Longstanding, intermittent, sharp and achy chest discomfort, exacerbated by physical activity. No associated shortness of breath, nausea, or diaphoresis. EKG shows no acute ischemic changes. -Order coronary CTA to exclude obstructive CAD. No BB needed.  -Check BMP for  pre-procedure labs.  Type 1 Diabetes Well-controlled with recent A1C of 6.1. -Continue current management.  Hyperlipidemia On Atorvastatin 20mg  with recent LDL of 88. Given patient's diabetic status, LDL should be lower. -Plan to titrate Atorvastatin dose based on results of coronary CTA.  Follow-up Based on results of coronary CTA and potential adjustment of cholesterol medication.              Follow-up: Return if symptoms worsen or fail to  improve.  Signed, Lenna Gilford. Flora Lipps, MD, [facility]  [facility]  [address], [address] [place], [place] [address] [phone]  8:39 AM

## 2023-10-16 ENCOUNTER — Ambulatory Visit: Payer: Commercial Managed Care - PPO | Attending: Cardiovascular Disease | Admitting: Cardiovascular Disease

## 2023-10-16 ENCOUNTER — Encounter: Payer: Self-pay | Admitting: Cardiovascular Disease

## 2023-10-16 VITALS — BP 128/68 | HR 54 | Ht 71.0 in | Wt 174.0 lb

## 2023-10-16 DIAGNOSIS — R072 Precordial pain: Secondary | ICD-10-CM | POA: Diagnosis not present

## 2023-10-16 DIAGNOSIS — R079 Chest pain, unspecified: Secondary | ICD-10-CM

## 2023-10-16 DIAGNOSIS — E782 Mixed hyperlipidemia: Secondary | ICD-10-CM | POA: Diagnosis not present

## 2023-10-16 DIAGNOSIS — R931 Abnormal findings on diagnostic imaging of heart and coronary circulation: Secondary | ICD-10-CM | POA: Diagnosis not present

## 2023-10-16 LAB — BASIC METABOLIC PANEL
BUN/Creatinine Ratio: 16 (ref 9–20)
BUN: 16 mg/dL (ref 6–24)
CO2: 24 mmol/L (ref 20–29)
Calcium: 9.2 mg/dL (ref 8.7–10.2)
Chloride: 100 mmol/L (ref 96–106)
Creatinine, Ser: 1.01 mg/dL (ref 0.76–1.27)
Glucose: 175 mg/dL — ABNORMAL HIGH (ref 70–99)
Potassium: 5.3 mmol/L — ABNORMAL HIGH (ref 3.5–5.2)
Sodium: 137 mmol/L (ref 134–144)
eGFR: 87 mL/min/{1.73_m2} (ref >59)

## 2023-10-16 NOTE — Patient Instructions (Signed)
Medication Instructions:  - Your physician recommends that you continue on your current medications as directed. Please refer to the Current Medication list given to you today.    *If you need a refill on your cardiac medications before your next appointment, please call your pharmacy*   Lab Work: BMET    If you have labs (blood work) drawn today and your tests are completely normal, you will receive your results only by: MyChart Message (if you have MyChart) OR A paper copy in the mail If you have any lab test that is abnormal or we need to change your treatment, we will call you to review the results.   Testing/Procedures:   Your cardiac CT will be scheduled at one of the below locations:   [facility] [address] [place], [place] [address] [phone]  If scheduled at [facility], please arrive at the [facility] Entrance (Entrance C2) of [facility] 30 minutes prior to test start time. You can use the FREE valet parking offered at entrance C (encouraged to control the heart rate for the test)  Proceed to the [facility] Radiology Department (first floor) to check-in and test prep.  All radiology patients and guests should use entrance C2 at [facility], accessed from [facility] even though the hospital's physical address listed is [address]    If scheduled at [facility] or [facility], please arrive 15 mins early for check-in and test prep.  There is spacious parking and easy access to the radiology department from the [facility] entrance. Please enter here and check-in with the desk attendant.   If scheduled at [facility], please arrive 30 minutes early for check-in and test prep.  Please follow these instructions carefully (unless otherwise directed):  An IV will be required for this test and Nitroglycerin will be  given.  Hold all erectile dysfunction medications at least 3 days (72 hrs) prior to test. (Ie viagra, cialis, sildenafil, tadalafil, etc)   On the Night Before the Test: Be sure to Drink plenty of water. Do not consume any caffeinated/decaffeinated beverages or chocolate 12 hours prior to your test. Do not take any antihistamines 12 hours prior to your test.  On the Day of the Test: Drink plenty of water until 1 hour prior to the test. Do not eat any food 1 hour prior to test. You may take your regular medications prior to the test.  If you take Furosemide/Hydrochlorothiazide/Spironolactone/Chlorthalidone, please HOLD on the morning of the test. Patients who wear a continuous glucose monitor MUST remove the device prior to scanning.        After the Test: Drink plenty of water. After receiving IV contrast, you may experience a mild flushed feeling. This is normal. On occasion, you may experience a mild rash up to 24 hours after the test. This is not dangerous. If this occurs, you can take Benadryl 25 mg, Zyrtec, Claritin, or Allegra and increase your fluid intake. (Patients taking Tikosyn should avoid Benadryl, and may take Zyrtec, Claritin, or Allegra) If you experience trouble breathing, this can be serious. If it is severe call 911 IMMEDIATELY. If it is mild, please call our office.  We will call to schedule your test 2-4 weeks out understanding that some insurance companies will need an authorization prior to the service being performed.   For more information and frequently asked questions, please visit our website :  [email or web address]  For non-scheduling related questions, please contact the cardiac imaging nurse navigator should you have any questions/concerns: Cardiac Imaging Nurse Navigators Direct Office Dial: [phone]   For scheduling needs, including cancellations and rescheduling, please call [place], [phone].    Follow-Up: At [facility],  you and your health needs are our priority.  As part of our continuing mission to provide you with exceptional heart care, we have created designated Provider Care Teams.  These Care Teams include your primary Cardiologist (physician) and Advanced Practice Providers (APPs -  Physician Assistants and Nurse Practitioners) who all work together to provide you with the care you need, when you need it.  We recommend signing up for the patient portal called "MyChart".  Sign up information is provided on this After Visit Summary.  MyChart is used to connect with patients for Virtual Visits (Telemedicine).  Patients are able to view lab/test results, encounter notes, upcoming appointments, etc.  Non-urgent messages can be sent to your provider as well.   To learn more about what you can do with MyChart, go to [email or web address].    Your next appointment:   FOLLOW UP AS NEEDED PENDING RESULTS OF CARDIAC TESTING     Provider:   Lennie Odor, MD     Other Instructions

## 2023-10-18 ENCOUNTER — Encounter: Payer: Self-pay | Admitting: Cardiovascular Disease

## 2023-11-02 ENCOUNTER — Telehealth (HOSPITAL_COMMUNITY): Payer: Self-pay | Admitting: Emergency Medicine

## 2023-11-02 NOTE — Telephone Encounter (Signed)
 Attempted to call patient regarding upcoming cardiac CT appointment. Left message on voicemail with name and callback number Rockwell Alexandria RN Navigator Cardiac Imaging [facility] [phone] Office [phone] Cell

## 2023-11-02 NOTE — Telephone Encounter (Signed)
 Reaching out to patient to offer assistance regarding upcoming cardiac imaging study; pt verbalizes understanding of appt date/time, parking situation and where to check in, pre-test NPO status and medications ordered, and verified current allergies; name and call back number provided for further questions should they arise Rockwell Alexandria RN Navigator Cardiac Imaging [facility] [phone] office [phone] cell

## 2023-11-03 ENCOUNTER — Ambulatory Visit (HOSPITAL_COMMUNITY)
Admission: RE | Admit: 2023-11-03 | Discharge: 2023-11-03 | Disposition: A | Discharge status: Home / Self Care | Payer: Commercial Managed Care - PPO | Source: Ambulatory Visit | Attending: Cardiovascular Disease | Admitting: Cardiovascular Disease

## 2023-11-03 DIAGNOSIS — R079 Chest pain, unspecified: Secondary | ICD-10-CM | POA: Insufficient documentation

## 2023-11-03 DIAGNOSIS — R072 Precordial pain: Secondary | ICD-10-CM | POA: Insufficient documentation

## 2023-11-03 MED ORDER — IOHEXOL 350 MG/ML SOLN
95.0000 mL | Freq: Once | INTRAVENOUS | Status: AC | PRN
  Administered 2023-11-03: 95 mL via INTRAVENOUS

## 2023-11-03 MED ORDER — NITROGLYCERIN 0.4 MG SL SUBL
0.8000 mg | SUBLINGUAL_TABLET | Freq: Once | SUBLINGUAL | Status: AC
  Administered 2023-11-03: 0.8 mg via SUBLINGUAL

## 2023-11-03 MED ORDER — ATROPINE SULFATE 1 MG/10ML IJ SOSY
PREFILLED_SYRINGE | INTRAMUSCULAR | Status: AC
  Filled 2023-11-03: qty 10

## 2023-11-03 MED ORDER — NITROGLYCERIN 0.4 MG SL SUBL
SUBLINGUAL_TABLET | SUBLINGUAL | Status: AC
  Filled 2023-11-03: qty 2

## 2023-11-03 NOTE — Progress Notes (Signed)
 Patient ID: Stephen Marks, male   DOB: 1966-06-15, 58 y.o.   MRN: [id number] Vss  pt not diaphortic. Pt states feels fine. Pt to have ct heart done.

## 2023-11-03 NOTE — Progress Notes (Signed)
 Patient ID: Stephen Marks, male   DOB: Sep 14, 1966, 58 y.o.   MRN: [id number] Pt still diaphortic heart rate 61 bp 118/70.pt states feeling fine.

## 2023-11-03 NOTE — Progress Notes (Signed)
 Patient ID: Stephen Marks, male   DOB: [birth year], 58 y.o.   MRN: [id number] Doing  iv  pt became diaphortic stopped talking. Pt very pale. Pt passed out. Heart rate 39 bp 92/53. Pt came too right away. Oxygen applied chair laid back.

## 2023-11-04 ENCOUNTER — Encounter: Payer: Self-pay | Admitting: Cardiovascular Disease

## 2023-11-05 ENCOUNTER — Other Ambulatory Visit: Payer: Self-pay

## 2023-11-05 MED ORDER — ATORVASTATIN CALCIUM 40 MG PO TABS: 40.0000 mg | ORAL_TABLET | Freq: Every day | ORAL | 3 refills | Status: AC

## 2023-11-15 ENCOUNTER — Other Ambulatory Visit: Payer: Self-pay | Admitting: Medical Genetics

## 2024-01-27 ENCOUNTER — Ambulatory Visit: Payer: Commercial Managed Care - PPO | Admitting: Adult Health

## 2024-02-03 ENCOUNTER — Other Ambulatory Visit: Payer: Self-pay | Admitting: Adult Health

## 2024-02-03 DIAGNOSIS — F411 Generalized anxiety disorder: Secondary | ICD-10-CM

## 2024-02-03 DIAGNOSIS — F331 Major depressive disorder, recurrent, moderate: Secondary | ICD-10-CM

## 2024-02-17 ENCOUNTER — Telehealth: Admitting: Adult Health

## 2024-02-17 ENCOUNTER — Encounter: Payer: Self-pay | Admitting: Adult Health

## 2024-02-17 DIAGNOSIS — F331 Major depressive disorder, recurrent, moderate: Secondary | ICD-10-CM

## 2024-02-17 DIAGNOSIS — F411 Generalized anxiety disorder: Secondary | ICD-10-CM | POA: Diagnosis not present

## 2024-02-17 MED ORDER — DULOXETINE HCL 30 MG PO CPEP: 30.0000 mg | ORAL_CAPSULE | Freq: Every day | ORAL | 1 refills | Status: AC

## 2024-02-17 MED ORDER — ESCITALOPRAM OXALATE 10 MG PO TABS: 10.0000 mg | ORAL_TABLET | Freq: Every day | ORAL | 1 refills | Status: AC

## 2024-02-17 NOTE — Progress Notes (Signed)
 Stephen Marks [id number] 06-01-1966 58 y.o.  Virtual Visit via Video Note  I connected with pt @ on 02/17/24 at  9:30 AM EDT by a video enabled telemedicine application and verified that I am speaking with the correct person using two identifiers.   I discussed the limitations of evaluation and management by telemedicine and the availability of in person appointments. The patient expressed understanding and agreed to proceed.  I discussed the assessment and treatment plan with the patient. The patient was provided an opportunity to ask questions and all were answered. The patient agreed with the plan and demonstrated an understanding of the instructions.   The patient was advised to call back or seek an in-person evaluation if the symptoms worsen or if the condition fails to improve as anticipated.  I provided 10 minutes of non-face-to-face time during this encounter.  The patient was located at home.  The provider was located at [facility].   Reagan Camera, NP   Subjective:   Patient ID:  Stephen Marks is a 77 y.o. (DOB 20-May-1966) male.  Chief Complaint: No chief complaint on file.   HPI KIARA KEEP presents for follow-up of MDD and GAD.  Describes mood today as "ok". [place]. Denies tearfulness. Mood symptoms - denies depression, anxiety and irritability. Reports stable interest and motivation. Denies panic attacks. Denies worry, rumination and over thinking. Reports mood is stable. Stating "I feel like I'm doing ok". Feels like the Lexapro  and Cymbalta  are helpful. Seeing therapist regularly - [facility]. Taking medications as prescribed. Energy levels improved. Active, has a regular exercise routine. Enjoys some usual interests and activities. Married. Lives with wife and son. Spending time with family. Appetite adequate. Weight stable - 175 pounds.  Sleeps well most nights. Averages 7+ hours. Focus and concentration stable. Completing tasks. Managing  aspects of household. 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  Denies AH or VH. Denies self harm. Denies substance use.    Previous medication trials:  Wellbutrin   Review of Systems:  Review of Systems  Musculoskeletal:  Negative for gait problem.  Neurological:  Negative for tremors.  Psychiatric/Behavioral:         Please refer to HPI    Medications: I have reviewed the patient's current medications.  Current Outpatient Medications  Medication Sig Dispense Refill   atorvastatin  (LIPITOR) 40 MG tablet Take 1 tablet (40 mg total) by mouth daily. 90 tablet 3   cetirizine (ZYRTEC) 10 MG tablet Take 10 mg by mouth daily.     DULoxetine  (CYMBALTA ) 30 MG capsule Take 1 capsule (30 mg total) by mouth daily. 90 capsule 1   escitalopram  (LEXAPRO ) 10 MG tablet TAKE 1 TABLET DAILY 90 tablet 0   Flaxseed, Linseed, (FLAXSEED OIL PO) Take by mouth.     fluticasone  (FLONASE ) 50 MCG/ACT nasal spray Place into both nostrils daily.     insulin  lispro (HUMALOG) 100 UNIT/ML injection Inject into the skin 3 (three) times daily before meals.     montelukast  (SINGULAIR ) 10 MG tablet Can take one tablet by mouth once daily as directed. 30 tablet 11   Multiple Vitamin (MULTIVITAMIN) tablet Take 1 tablet by mouth daily.     ramipril (ALTACE) 10 MG capsule Take 10 mg by mouth daily.     No current facility-administered medications for this visit.    Medication Side Effects: None  Allergies:  Allergies  Allergen Reactions   Cefuroxime Hives   Penicillins Hives   Sulfa Antibiotics  Past Medical History:  Diagnosis Date   Coronary artery disease    Diabetes mellitus without complication (HCC)    Hyperlipidemia     Family History  Problem Relation Age of Onset   Heart disease Mother    Prostate cancer Father     Social History   Socioeconomic History   Marital status: Married    Spouse name: Not on file   Number of children: 1   Years of education: Not on file   Highest  education level: Not on file  Occupational History   Occupation: Psychologist, forensic  Tobacco Use   Smoking status: Never   Smokeless tobacco: Never  Vaping Use   Vaping status: Never Used  Substance and Sexual Activity   Alcohol use: Yes    Comment: 1-2 WEEK   Drug use: Not Currently   Sexual activity: Not on file  Other Topics Concern   Not on file  Social History Narrative   Not on file   Social Drivers of Health   Financial Resource Strain: Not on file  Food Insecurity: Not on file  Transportation Needs: Not on file  Physical Activity: Not on file  Stress: Not on file  Social Connections: Not on file  Intimate Partner Violence: Not on file    Past Medical History, Surgical history, Social history, and Family history were reviewed and updated as appropriate.   Please see review of systems for further details on the patient's review from today.   Objective:   Physical Exam:  There were no vitals taken for this visit.  Physical Exam Constitutional:      General: He is not in acute distress. Musculoskeletal:        General: No deformity.  Neurological:     Mental Status: He is alert and oriented to person, place, and time.     Coordination: Coordination normal.  Psychiatric:        Attention and Perception: Attention and perception normal. He does not perceive auditory or visual hallucinations.        Mood and Affect: Mood normal. Mood is not anxious or depressed. Affect is not labile, blunt, angry or inappropriate.        Speech: Speech normal.        Behavior: Behavior normal.        Thought Content: Thought content normal. Thought content is not paranoid or delusional. Thought content does not include homicidal or suicidal ideation. Thought content does not include homicidal or suicidal plan.        Cognition and Memory: Cognition and memory normal.        Judgment: Judgment normal.     Comments: Insight intact     Lab Review:     Component  Value Date/Time   NA 137 10/16/2023 0904   K 5.3 (H) 10/16/2023 0904   CL 100 10/16/2023 0904   CO2 24 10/16/2023 0904   GLUCOSE 175 (H) 10/16/2023 0904   GLUCOSE 181 (H) 10/31/2009 1510   BUN 16 10/16/2023 0904   CREATININE 1.01 10/16/2023 0904   CALCIUM  9.2 10/16/2023 0904   PROT 7.5 10/31/2009 1510   ALBUMIN 4.2 10/31/2009 1510   AST 40 (H) 10/31/2009 1510   ALT 46 10/31/2009 1510   ALKPHOS 69 10/31/2009 1510   BILITOT 0.5 10/31/2009 1510   GFRNONAA >60 10/31/2009 1510   GFRAA  10/31/2009 1510    >60        The eGFR has been calculated using the  MDRD equation. This calculation has not been validated in all clinical situations. eGFR's persistently <60 mL/min signify possible Chronic Kidney Disease.       Component Value Date/Time   WBC 8.6 10/31/2009 1510   RBC 4.93 10/31/2009 1510   HGB 14.5 10/31/2009 1510   HCT 42.3 10/31/2009 1510   PLT 155 10/31/2009 1510   MCV 85.8 10/31/2009 1510   MCHC 34.3 10/31/2009 1510   RDW 12.6 10/31/2009 1510   LYMPHSABS 0.3 (L) 10/31/2009 1510   MONOABS 0.1 10/31/2009 1510   EOSABS 0.0 10/31/2009 1510   BASOSABS 0.0 10/31/2009 1510    No results found for: "POCLITH", "LITHIUM"   No results found for: "PHENYTOIN", "PHENOBARB", "VALPROATE", "CBMZ"   .res Assessment: Plan:    Continue:  Lexapro  10mg  daily Cymbalta  30mg  daily  Continue NAC and Mg  Continue therapy - Cathy Showfety  RTC 6 months  10 minutes spent dedicated to the care of this patient on the date of this encounter to include pre-visit review of records, ordering of medication, post visit documentation, and face-to-face time with the patient discussing MDD and GAD. Discussed continuing current medication regimen.  Patient advised to contact office with any questions, adverse effects, or acute worsening in signs and symptoms.  There are no diagnoses linked to this encounter.   Please see After Visit Summary for patient specific instructions.  Future  Appointments  Date Time Provider Department Center  02/17/2024  9:30 AM Aldona Bryner Nattalie, NP CP-CP None    No orders of the defined types were placed in this encounter.     -------------------------------

## 2024-07-14 ENCOUNTER — Other Ambulatory Visit: Payer: Self-pay | Admitting: Medical Genetics

## 2024-07-14 DIAGNOSIS — Z006 Encounter for examination for normal comparison and control in clinical research program: Secondary | ICD-10-CM
